# Patient Record
Sex: Female | Born: 1965 | Race: White | Hispanic: No | Marital: Single | Smoking: Never smoker
Health system: Southern US, Community
[De-identification: ages and names within clinical notes are randomized; demographics above are authoritative.]

## PROBLEM LIST (undated history)

## (undated) DIAGNOSIS — S42409A Unspecified fracture of lower end of unspecified humerus, initial encounter for closed fracture: Secondary | ICD-10-CM

## (undated) DIAGNOSIS — R232 Flushing: Secondary | ICD-10-CM

## (undated) DIAGNOSIS — R61 Generalized hyperhidrosis: Secondary | ICD-10-CM

## (undated) DIAGNOSIS — N809 Endometriosis, unspecified: Secondary | ICD-10-CM

## (undated) DIAGNOSIS — M1990 Unspecified osteoarthritis, unspecified site: Secondary | ICD-10-CM

## (undated) DIAGNOSIS — K219 Gastro-esophageal reflux disease without esophagitis: Secondary | ICD-10-CM

## (undated) DIAGNOSIS — Z9889 Other specified postprocedural states: Secondary | ICD-10-CM

## (undated) DIAGNOSIS — R112 Nausea with vomiting, unspecified: Secondary | ICD-10-CM

## (undated) DIAGNOSIS — F419 Anxiety disorder, unspecified: Secondary | ICD-10-CM

## (undated) HISTORY — DX: Unspecified osteoarthritis, unspecified site: M19.90

## (undated) HISTORY — DX: Flushing: R23.2

## (undated) HISTORY — PX: OTHER SURGICAL HISTORY: SHX169

## (undated) HISTORY — DX: Anxiety disorder, unspecified: F41.9

## (undated) HISTORY — DX: Endometriosis, unspecified: N80.9

## (undated) HISTORY — DX: Unspecified fracture of lower end of unspecified humerus, initial encounter for closed fracture: S42.409A

## (undated) HISTORY — DX: Generalized hyperhidrosis: R61

## (undated) HISTORY — PX: ABDOMINAL HYSTERECTOMY: SHX81

---

## 1988-02-14 HISTORY — PX: OTHER SURGICAL HISTORY: SHX169

## 2004-07-04 ENCOUNTER — Ambulatory Visit: Payer: Self-pay | Admitting: Family Medicine

## 2005-04-07 ENCOUNTER — Ambulatory Visit: Payer: Self-pay | Admitting: Family Medicine

## 2006-02-13 HISTORY — PX: BILATERAL SALPINGOOPHORECTOMY: SHX1223

## 2006-12-26 ENCOUNTER — Ambulatory Visit: Payer: Self-pay | Admitting: Obstetrics & Gynecology

## 2007-01-24 ENCOUNTER — Ambulatory Visit: Payer: Self-pay | Admitting: Obstetrics & Gynecology

## 2008-02-14 ENCOUNTER — Ambulatory Visit: Payer: Self-pay | Admitting: Family Medicine

## 2008-03-10 ENCOUNTER — Ambulatory Visit: Payer: Self-pay | Admitting: Sports Medicine

## 2009-02-13 DIAGNOSIS — S42409A Unspecified fracture of lower end of unspecified humerus, initial encounter for closed fracture: Secondary | ICD-10-CM

## 2009-02-13 HISTORY — DX: Unspecified fracture of lower end of unspecified humerus, initial encounter for closed fracture: S42.409A

## 2009-05-17 ENCOUNTER — Ambulatory Visit: Payer: Self-pay | Admitting: Obstetrics and Gynecology

## 2009-10-15 ENCOUNTER — Ambulatory Visit: Payer: Self-pay | Admitting: Internal Medicine

## 2009-11-22 ENCOUNTER — Ambulatory Visit: Payer: Self-pay | Admitting: Obstetrics and Gynecology

## 2009-12-08 ENCOUNTER — Ambulatory Visit: Payer: Self-pay | Admitting: Obstetrics & Gynecology

## 2009-12-08 ENCOUNTER — Encounter: Payer: Self-pay | Admitting: Family Medicine

## 2009-12-08 LAB — CONVERTED CEMR LAB
HCT: 41.5 % (ref 36.0–46.0)
MCHC: 32.8 g/dL (ref 30.0–36.0)
MCV: 98.3 fL (ref 78.0–100.0)
Platelets: 234 10*3/uL (ref 150–400)
WBC: 6.7 10*3/uL (ref 4.0–10.5)

## 2010-05-20 ENCOUNTER — Other Ambulatory Visit: Payer: Self-pay | Admitting: Family Medicine

## 2010-06-28 NOTE — Assessment & Plan Note (Signed)
[name], [name]                ACCOUNT NO.:  [id number]   MEDICAL RECORD NO.:  [id number]          PATIENT TYPE:  POB   LOCATION:  [facility] at [facility]         FACILITY:  [facility]   PHYSICIAN:  Victoria Donovan, MD        DATE OF BIRTH:  [birth year]   DATE OF SERVICE:  05/17/2009                                  CLINIC NOTE   The patient is a 45 year old Caucasian female who underwent total  abdominal hysterectomy and bilateral salpingo-oophorectomy in 2008 for  severe endometriosis, since that time she has been on estrogen  replacement therapy.  She has been on 50 mg Zoloft in order to make her  feel like herself and not killing someone, she states and she has been  on a mild diuretic.   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   PHYSICAL EXAMINATION:  GENERAL:  Well-developed, well-nourished white  female in no acute distress  VITAL SIGNS:  5 feet 10 inches tall, 162 pounds, blood pressure 122/58,  and pulse 68 per minute.  HEENT:  Within normal limits.  NECK:  Supple.  Thyroid symmetrical, no dominant masses.  LUNGS:  Clear to auscultation and percussion.  HEART:  No murmur.  Normal sinus rhythm.  BREASTS:  Symmetrical.  No dominant masses.  No nipple discharge.  ABDOMEN:  Soft, flat, nontender.  No masses or organomegaly.  GENITALIA:  External normal.  BUS within normal limits.  Vagina is clean  and well rugated.  Status, total abdominal hysterectomy.  No pelvic  masses palpated on bimanual exam.  EXTREMITIES:  No edema.  No varicosities.   IMPRESSION:  Normal gynecological examination, renewal of Zoloft; the  patient was given 100 mg daily; Enjuvia 1.25 mg a day; and bumetanide  one half a day.  Also, the  patient is going to have her first mammogram  scheduled.           ______________________________  Victoria Donovan, MD     PR/MEDQ  D:  05/17/2009  T:  05/18/2009  Job:  [id number]

## 2010-07-13 ENCOUNTER — Ambulatory Visit: Payer: Self-pay | Admitting: Family Medicine

## 2010-07-27 ENCOUNTER — Ambulatory Visit: Payer: Self-pay | Admitting: Surgery

## 2010-07-27 DIAGNOSIS — F17200 Nicotine dependence, unspecified, uncomplicated: Secondary | ICD-10-CM

## 2010-08-03 ENCOUNTER — Ambulatory Visit: Payer: Self-pay | Admitting: Surgery

## 2010-08-05 LAB — PATHOLOGY REPORT

## 2010-08-18 ENCOUNTER — Ambulatory Visit: Payer: Self-pay | Admitting: Surgery

## 2010-08-20 ENCOUNTER — Inpatient Hospital Stay: Payer: Self-pay | Admitting: Surgery

## 2010-08-24 LAB — PATHOLOGY REPORT

## 2010-09-19 ENCOUNTER — Other Ambulatory Visit: Payer: Self-pay | Admitting: *Deleted

## 2010-09-19 DIAGNOSIS — N951 Menopausal and female climacteric states: Secondary | ICD-10-CM

## 2010-09-19 MED ORDER — ESTROGENS CONJ SYNTHETIC B 1.25 MG PO TABS: 1.2500 mg | ORAL_TABLET | Freq: Every day | ORAL | Status: DC

## 2010-09-19 NOTE — Telephone Encounter (Signed)
Patient needs refill of her Enjuvia 1.25.

## 2010-09-22 ENCOUNTER — Telehealth: Payer: Self-pay | Admitting: *Deleted

## 2010-09-22 DIAGNOSIS — N951 Menopausal and female climacteric states: Secondary | ICD-10-CM

## 2010-09-22 MED ORDER — ESTRADIOL 1 MG PO TABS: 1.0000 mg | ORAL_TABLET | Freq: Every day | ORAL | Status: DC

## 2010-09-22 NOTE — Telephone Encounter (Signed)
Victoria [name] is too expensive and patient would like a generic alternitive so we will call in Estradiol 1mg .  Patient will call to let us know if this is working for her.

## 2010-10-04 ENCOUNTER — Telehealth: Payer: Self-pay | Admitting: *Deleted

## 2010-10-04 DIAGNOSIS — N951 Menopausal and female climacteric states: Secondary | ICD-10-CM

## 2010-10-04 MED ORDER — VENLAFAXINE HCL ER 75 MG PO CP24: 75.0000 mg | ORAL_CAPSULE | Freq: Every day | ORAL | Status: DC

## 2010-10-04 NOTE — Telephone Encounter (Signed)
Patient needs refill of her Effexor xr 75mg .  Rx called into [facility].

## 2010-10-05 ENCOUNTER — Telehealth: Payer: Self-pay | Admitting: *Deleted

## 2010-10-05 DIAGNOSIS — N951 Menopausal and female climacteric states: Secondary | ICD-10-CM

## 2010-10-05 MED ORDER — VENLAFAXINE HCL ER 37.5 MG PO CP24: 37.5000 mg | ORAL_CAPSULE | Freq: Every day | ORAL | Status: DC

## 2010-10-05 NOTE — Telephone Encounter (Signed)
Patient has been taking the 37.5 mg capsules three capsules daily.  Rx was changed and sent with the right dose.

## 2010-11-08 ENCOUNTER — Telehealth: Payer: Self-pay | Admitting: *Deleted

## 2010-11-08 DIAGNOSIS — N951 Menopausal and female climacteric states: Secondary | ICD-10-CM

## 2010-11-08 MED ORDER — VENLAFAXINE HCL ER 150 MG PO CP24: 150.0000 mg | ORAL_CAPSULE | Freq: Every day | ORAL | Status: DC

## 2010-11-08 NOTE — Telephone Encounter (Signed)
Patient is currently on 112.5mg  of Effexor and would like to go ahead and move up to the 150.  She is feeling a lot better but thinks the 150mg  would make things right.  I called the change in to her pharmacy and she will call back to let me know how she is doing.

## 2011-02-14 HISTORY — PX: CHOLECYSTECTOMY: SHX55

## 2011-03-22 ENCOUNTER — Ambulatory Visit: Payer: Self-pay | Admitting: Family Medicine

## 2011-05-08 ENCOUNTER — Ambulatory Visit: Payer: BC Managed Care – PPO | Admitting: Obstetrics & Gynecology

## 2011-05-08 DIAGNOSIS — N899 Noninflammatory disorder of vagina, unspecified: Secondary | ICD-10-CM

## 2011-06-12 ENCOUNTER — Telehealth: Payer: Self-pay | Admitting: *Deleted

## 2011-06-12 DIAGNOSIS — N390 Urinary tract infection, site not specified: Secondary | ICD-10-CM

## 2011-06-12 MED ORDER — CIPROFLOXACIN HCL 500 MG PO TABS: 500.0000 mg | ORAL_TABLET | Freq: Two times a day (BID) | ORAL | Status: AC

## 2011-06-12 NOTE — Telephone Encounter (Signed)
Patient is having symptoms of a uti and is not able to come in due to work schedule.  We will call in med for her and she will follow up with appointment if her symptoms do not go away.

## 2011-07-11 ENCOUNTER — Telehealth: Payer: Self-pay

## 2011-07-11 DIAGNOSIS — F419 Anxiety disorder, unspecified: Secondary | ICD-10-CM

## 2011-07-11 NOTE — Telephone Encounter (Signed)
Patient called she is on Effexor generic but says it makes her heart race would like to have Zoloft instead since she has taken this in the past and has worked for her. Please le me know what you would like me to do. Thanks!

## 2011-07-11 NOTE — Telephone Encounter (Signed)
Patient called needing to see if

## 2011-07-12 MED ORDER — SERTRALINE HCL 50 MG PO TABS: 50.0000 mg | ORAL_TABLET | Freq: Every day | ORAL | Status: DC

## 2011-07-12 NOTE — Telephone Encounter (Signed)
May call in Zoloft, try to figure out what dose---may need to involve Inetta Fermo

## 2011-07-12 NOTE — Telephone Encounter (Signed)
Addended by: Barbara Cower on: 07/12/2011 03:02 PM   Modules accepted: Orders

## 2011-08-11 ENCOUNTER — Other Ambulatory Visit: Payer: Self-pay | Admitting: *Deleted

## 2011-08-11 DIAGNOSIS — R609 Edema, unspecified: Secondary | ICD-10-CM

## 2011-08-11 MED ORDER — BUMETANIDE 1 MG PO TABS: 1.0000 mg | ORAL_TABLET | Freq: Every day | ORAL | Status: DC

## 2011-08-11 NOTE — Telephone Encounter (Signed)
Patient requesting refill of medication 

## 2011-09-08 ENCOUNTER — Telehealth: Payer: Self-pay | Admitting: *Deleted

## 2011-09-08 DIAGNOSIS — N951 Menopausal and female climacteric states: Secondary | ICD-10-CM

## 2011-09-08 MED ORDER — ESTRADIOL 1 MG PO TABS: 1.0000 mg | ORAL_TABLET | Freq: Every day | ORAL | Status: DC

## 2011-09-08 NOTE — Telephone Encounter (Signed)
Patient needs refill of estradiol 1mg  called into [facility] [place]

## 2011-10-01 ENCOUNTER — Ambulatory Visit: Payer: Self-pay

## 2012-01-01 ENCOUNTER — Ambulatory Visit: Payer: Self-pay | Admitting: Unknown Physician Specialty

## 2012-03-03 IMAGING — CR DG ABDOMEN 1V
1 series · 1 of 1 positions shown · non-contrast
Comparison: none

REASON FOR EXAM: f/u abd pain 2 weeks post lap chole
COMMENTS:

PROCEDURE:     DXR - DXR KIDNEY URETER BLADDER  - August 21, 2010  [DATE]
RESULT:     Comparisons:  None

[view not recorded]
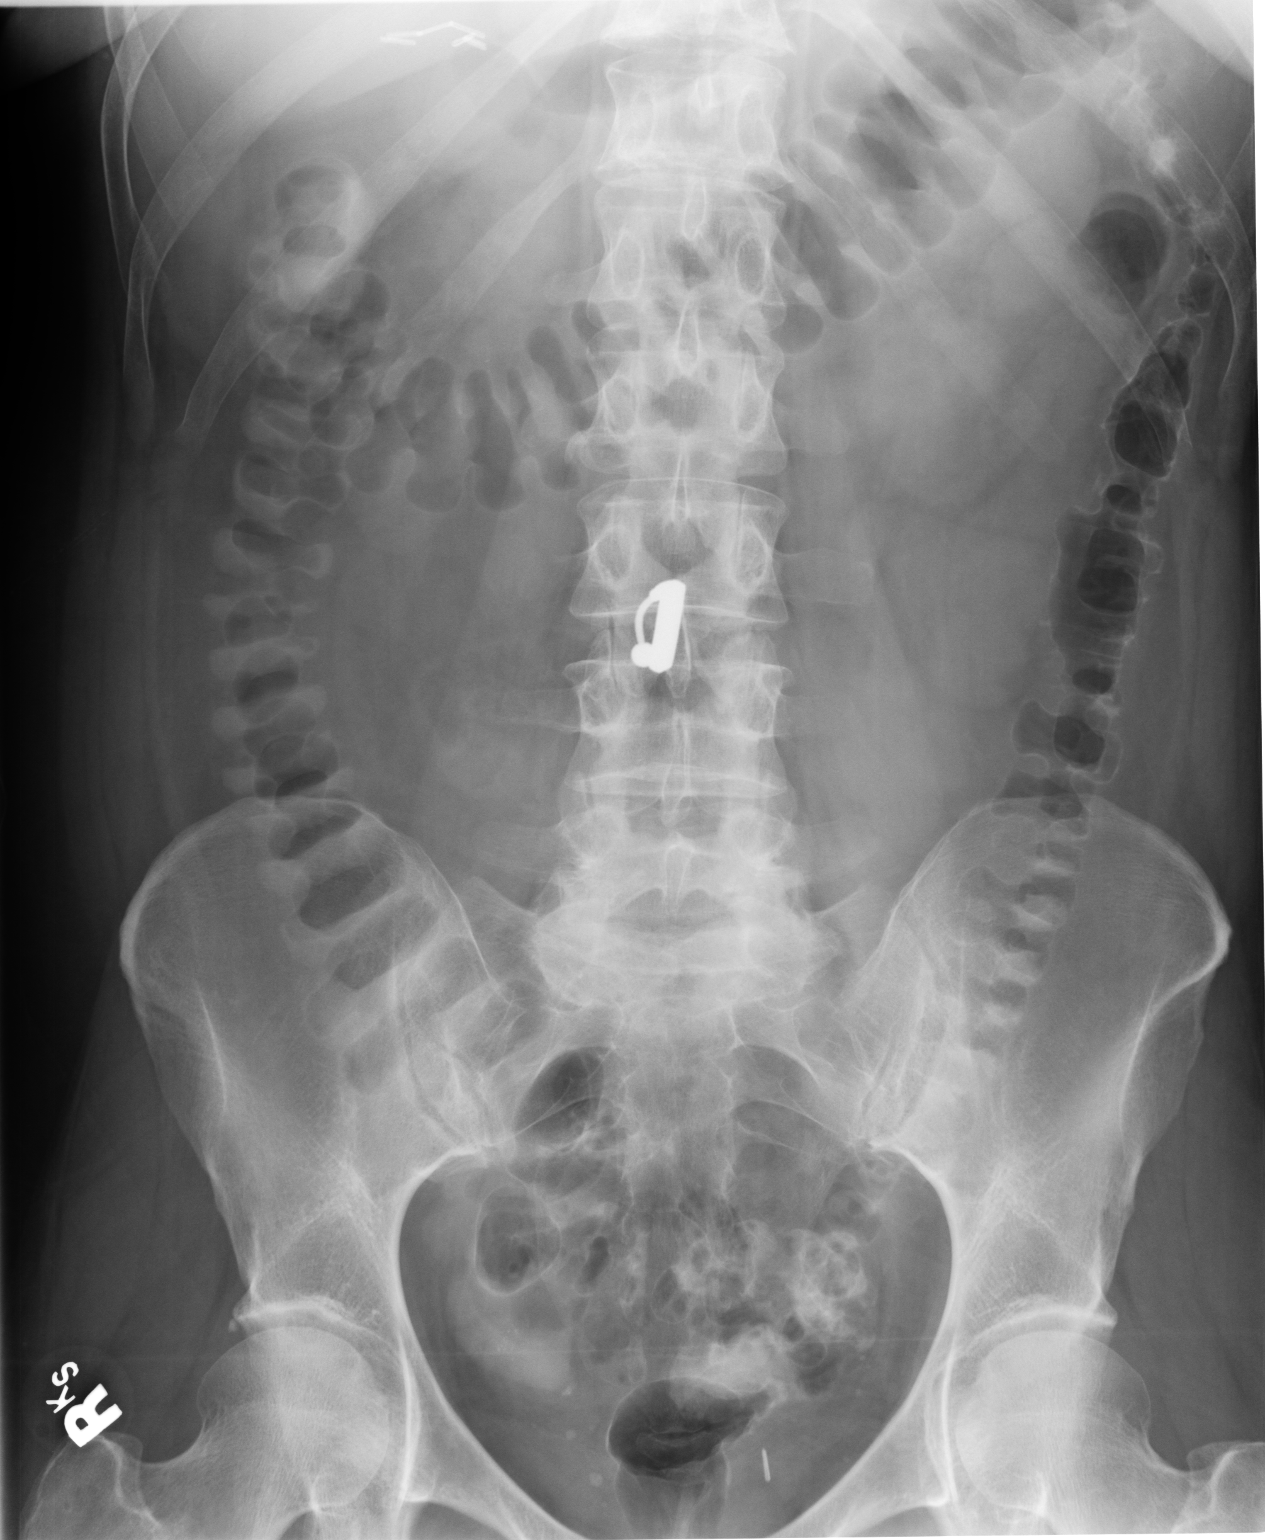

[1 of 1 positions shown; findings below may reference images not displayed]

FINDINGS: Supine radiograph of the abdomen is provided.

There is a nonspecific bowel gas pattern. There is no bowel dilatation to
suggest obstruction. There is no pathologic calcification along the expected
course of the ureters. There is no evidence of pneumoperitoneum, portal
venous gas, or pneumatosis.

The osseous structures are unremarkable.
IMPRESSION: Unremarkable KUB.

## 2012-10-15 ENCOUNTER — Ambulatory Visit: Payer: Self-pay | Admitting: Family Medicine

## 2012-12-11 ENCOUNTER — Telehealth (HOSPITAL_COMMUNITY): Payer: Self-pay | Admitting: *Deleted

## 2012-12-11 NOTE — Telephone Encounter (Signed)
Telephoned patient and left message to return call to [facility] 

## 2012-12-19 ENCOUNTER — Other Ambulatory Visit: Payer: Self-pay | Admitting: Obstetrics and Gynecology

## 2012-12-19 DIAGNOSIS — Z1231 Encounter for screening mammogram for malignant neoplasm of breast: Secondary | ICD-10-CM

## 2013-01-21 ENCOUNTER — Ambulatory Visit (HOSPITAL_COMMUNITY): Payer: BC Managed Care – PPO

## 2013-03-12 ENCOUNTER — Ambulatory Visit: Payer: Self-pay

## 2013-12-24 ENCOUNTER — Ambulatory Visit: Payer: Self-pay | Admitting: Family Medicine

## 2014-01-15 ENCOUNTER — Ambulatory Visit: Payer: Self-pay | Admitting: Unknown Physician Specialty

## 2014-01-15 LAB — CLOSTRIDIUM DIFFICILE(ARMC)

## 2014-12-15 ENCOUNTER — Other Ambulatory Visit: Payer: Self-pay | Admitting: Family Medicine

## 2014-12-15 DIAGNOSIS — R1013 Epigastric pain: Secondary | ICD-10-CM

## 2014-12-17 ENCOUNTER — Encounter: Payer: Self-pay | Admitting: Emergency Medicine

## 2014-12-17 ENCOUNTER — Ambulatory Visit (INDEPENDENT_AMBULATORY_CARE_PROVIDER_SITE_OTHER): Payer: Medicaid Other

## 2014-12-17 ENCOUNTER — Ambulatory Visit
Admission: EM | Admit: 2014-12-17 | Discharge: 2014-12-17 | Disposition: A | Discharge status: Home / Self Care | Payer: Medicaid Other | Attending: Family Medicine | Admitting: Family Medicine

## 2014-12-17 DIAGNOSIS — S8001XA Contusion of right knee, initial encounter: Secondary | ICD-10-CM | POA: Diagnosis not present

## 2014-12-17 DIAGNOSIS — M25561 Pain in right knee: Secondary | ICD-10-CM

## 2014-12-17 MED ORDER — OXYCODONE-ACETAMINOPHEN 5-325 MG PO TABS: 1.0000 | ORAL_TABLET | Freq: Three times a day (TID) | ORAL | Status: DC | PRN

## 2014-12-17 MED ORDER — MELOXICAM 15 MG PO TABS: 15.0000 mg | ORAL_TABLET | Freq: Every day | ORAL | Status: DC | PRN

## 2014-12-17 NOTE — ED Provider Notes (Signed)
____________________________________________  Time seen: Approximately 10:57 AM  I have reviewed the triage vital signs and the nursing notes.   HISTORY  Chief Complaint Knee Injury   HPI Victoria [name] is a 49 y.o. female presents with complaint of right knee pain. Patient reports last night she was walking in her garage and states that she tripped over chair cushions that were on the floor causing her to fall forwards. Patient states that she landed on her right knee hitting right knee against the side of lawnmower. States she did not hit left knee. States did not hit lawnmower blade.  Denies fall fully to the ground. Denies head injury or loss consciousness. Denies neck or back pain or injury. Reports right knee pain only.  States current right knee pain is 7 out of 10 and increases with movement and associated with weightbearing. Patient states pain is primarily to the front of her knee. States does not feel like knee will give out when applying weight but is more associated with pain. Denies prior knee pain or prior knee issues. Denies any other recent falls or injuries. Denies numbness or tingling sensation. Denies calf pain. Reports has continued to ambulate.  States right knee felt fine prior to tripping and falling. Denies chest pain, shortness breath, abdominal pain, neck pain, back pain, weakness, dizziness or other complaints.   Past Medical History  Diagnosis Date  . Severe endometriosis   . Anxiety   . Hot flashes   . Night sweats   . Arthritis   . Fracture of elbow 2011    There are no active problems to display for this patient.   Past Surgical History  Procedure Laterality Date  . Bilateral salpingoophorectomy  2008    Current Outpatient Rx  Name  Route  Sig  Dispense  Refill  . EXPIRED: bumetanide (BUMEX) 1 MG tablet   Oral   Take 1 tablet (1 mg total) by mouth daily.   45 tablet   6   . EXPIRED: estradiol (ESTRACE) 1 MG tablet   Oral   Take  1 tablet (1 mg total) by mouth daily.   30 tablet   11   . estrogens conjugated, synthetic B, (ENJUVIA) 1.25 MG tablet   Oral   Take 1.25 mg by mouth daily.         . sertraline (ZOLOFT) 50 MG tablet   Oral   Take 1 tablet (50 mg total) by mouth daily.   30 tablet   11   . EXPIRED: venlafaxine (EFFEXOR-XR) 150 MG 24 hr capsule   Oral   Take 1 capsule (150 mg total) by mouth daily.   30 capsule   11     Allergies Review of patient's allergies indicates no known allergies.  History reviewed. No pertinent family history.  Social History Social History  Substance Use Topics  . Smoking status: Never Smoker   . Smokeless tobacco: None  . Alcohol Use: Yes     Comment: 2 glasses of wine daily     Review of Systems Constitutional: No fever/chills Eyes: No visual changes. ENT: No sore throat. Cardiovascular: Denies chest pain. Respiratory: Denies shortness of breath. Gastrointestinal: No abdominal pain.  No nausea, no vomiting.  No diarrhea.  No constipation. Genitourinary: Negative for dysuria. Musculoskeletal: Negative for back pain.right knee pain.  Skin: Negative for rash. Neurological: Negative for headaches, focal weakness or numbness.  10-point ROS otherwise negative.  ____________________________________________   PHYSICAL EXAM:  VITAL SIGNS: ED Triage  Vitals  Enc Vitals Group     BP 12/17/14 1024 148/101 mmHg     Pulse Rate 12/17/14 1024 74     Resp 12/17/14 1024 20     Temp 12/17/14 1024 98 F (36.7 C)     Temp Source 12/17/14 1024 Tympanic     SpO2 12/17/14 1024 99 %     Weight --      Height 12/17/14 1024  (1.778 m)     Head Cir --      Peak Flow --      Pain Score 12/17/14 1027 7     Pain Loc --      Pain Edu? --      Excl. in GC? --    Today's Vitals   12/17/14 1024 12/17/14 1027 12/17/14 1134  BP: 148/101    Pulse: 74    Temp: 98 F (36.7 C)    TempSrc: Tympanic    Resp: 20    Height:  (1.778 m)    SpO2: 99%     PainSc:  7  6     Blood pressure recheck by Rosanne Ashing RN at 1136: 136/74  Constitutional: Alert and oriented. Well appearing and in no acute distress. Eyes: Conjunctivae are normal. PERRL. EOMI. Head: Atraumatic.  Nose: No congestion/rhinnorhea.  Mouth/Throat: Mucous membranes are moist.   Neck: No stridor.  No cervical spine tenderness to palpation. Cardiovascular: Normal rate, regular rhythm. Grossly normal heart sounds.  Good peripheral circulation. Respiratory: Normal respiratory effort.  No retractions. Lungs CTAB. Gastrointestinal: Soft and nontender. No distention.  Musculoskeletal: No lower or upper extremity tenderness nor edema.  No joint effusions. Bilateral pedal pulses equal and easily palpated. No cervical, thoracic or lumbar TTP.  Except: anterior to medial knee moderate tenderness to palpation with mild ecchymosis. Skin intact. Knees stable. Pain present with medial stress test. Full range of motion present. Mild antalgic gait. No pain with anterior or posterior drawer test, no pain with lateral stress test. Stable. Right leg otherwise nontender.  Neurologic:  Normal speech and language. No gross focal neurologic deficits are appreciated.  Skin:  Skin is warm, dry and intact. No rash noted. Psychiatric: Mood and affect are normal. Speech and behavior are normal.  ____________________________________________   LABS (all labs ordered are listed, but only abnormal results are displayed)  Labs Reviewed - No data to display  RADIOLOGY EXAM: RIGHT KNEE - COMPLETE 4+ VIEW  COMPARISON: None.  FINDINGS: Frontal, lateral, and bilateral oblique views were obtained. There is no demonstrable fracture or dislocation. There is a joint effusion. There is moderate narrowing of the patellofemoral joint. There is spurring arising from the posterior patella. There is also milder spurring medially and laterally. No erosive change. Calcifications are noted in the popliteal fossa  region.  IMPRESSION: Areas of osteoarthritic change. There is a joint effusion. No fracture or dislocation. Calcifications in the popliteal fossa region may have chronic posttraumatic or arthropathic etiology.   Electronically Signed By: Bretta Bang III M.D. On: 12/17/2014 11:02         I, Renford Dills, personally viewed and evaluated these images (plain radiographs) as part of my medical decision making.   PROCEDURES  Procedure(s) performed: Right knee immobilizer and crutches applied by RN. Neurovascular intact post application.  INITIAL IMPRESSION / ASSESSMENT AND PLAN / ED COURSE  Pertinent labs & imaging results that were available during my care of the patient were reviewed by me and considered in my medical decision making (see  chart for details).  Very well-appearing patient. No acute distress. Presents for right anterior knee pain post mechanical fall last night at home after tripping over a chair cushion in the floor causing her to fall onto right knee into lawnmower. Denies head injury or loss consciousness. Continues to ambulate but with pain. Denies other injury. Patient with anterior to medial knee moderate tenderness to palpation with mild ecchymosis. Skin intact. Knees stable. Pain present with medial stress test. Full range of motion present. Antalgic gait. Will evaluate x-ray.  Right knee xray areas of osteoarthritic changes, there is a joint effusion, no fracture or dislocation per radiologist. Will treat supportively with ice, rest, knee immobilizer, crutches, prn percocet and mobic. Suspect contusion and medial strain injury. Counseled for continued pain for patient to follow up with orthopedic this coming week, information given. Discussed follow up with Primary care physician this week. Discussed follow up and return parameters including no resolution or any worsening concerns. Patient verbalized understanding and agreed to plan.    FINAL CLINICAL  IMPRESSION(S) / ED DIAGNOSES  Final diagnoses:  Knee contusion, right, initial encounter  Right knee pain       Renford Dills, NP 12/17/14 1435  Renford Dills, NP 12/17/14 1439

## 2014-12-17 NOTE — Discharge Instructions (Signed)
Take medication as prescribed. Apply ice. Elevate. Wear splint and use crutches as long as pain continues.   As discussed, follow up with your primary care physician or orthopedic above next week as needed for continued pain.   Return to Urgent care for new or worsening concerns.   Knee Pain Knee pain is a very common symptom and can have many causes. Knee pain often goes away when you follow your health care provider's instructions for relieving pain and discomfort at home. However, knee pain can develop into a condition that needs treatment. Some conditions may include:  Arthritis caused by wear and tear (osteoarthritis).  Arthritis caused by swelling and irritation (rheumatoid arthritis or gout).  A cyst or growth in your knee.  An infection in your knee joint.  An injury that will not heal.  Damage, swelling, or irritation of the tissues that support your knee (torn ligaments or tendinitis). If your knee pain continues, additional tests may be ordered to diagnose your condition. Tests may include X-rays or other imaging studies of your knee. You may also need to have fluid removed from your knee. Treatment for ongoing knee pain depends on the cause, but treatment may include:  Medicines to relieve pain or swelling.  Steroid injections in your knee.  Physical therapy.  Surgery. HOME CARE INSTRUCTIONS  Take medicines only as directed by your health care provider.  Rest your knee and keep it raised (elevated) while you are resting.  Do not do things that cause or worsen pain.  Avoid high-impact activities or exercises, such as running, jumping rope, or doing jumping jacks.  Apply ice to the knee area:  Put ice in a plastic bag.  Place a towel between your skin and the bag.  Leave the ice on for 20 minutes, 2-3 times a day.  Ask your health care provider if you should wear an elastic knee support.  Keep a pillow under your knee when you sleep.  Lose weight if you  are overweight. Extra weight can put pressure on your knee.  Do not use any tobacco products, including cigarettes, chewing tobacco, or electronic cigarettes. If you need help quitting, ask your health care provider. Smoking may slow the healing of any bone and joint problems that you may have. SEEK MEDICAL CARE IF:  Your knee pain continues, changes, or gets worse.  You have a fever along with knee pain.  Your knee buckles or locks up.  Your knee becomes more swollen. SEEK IMMEDIATE MEDICAL CARE IF:   Your knee joint feels hot to the touch.  You have chest pain or trouble breathing.   This information is not intended to replace advice given to you by your health care provider. Make sure you discuss any questions you have with your health care provider.   Document Released: 11/27/2006 Document Revised: 02/20/2014 Document Reviewed: 09/15/2013 Elsevier Interactive Patient Education 2016 Else[employer] Inc[employer]  Contusion A contusion is a deep bruise. Contusions happen when an injury causes bleeding under the skin. Symptoms of bruising include pain, swelling, and discolored skin. The skin may turn blue, purple, or yellow. HOME CARE   Rest the injured area.  If told, put ice on the injured area.  Put ice in a plastic bag.  Place a towel between your skin and the bag.  Leave the ice on for 20 minutes, 2-3 times per day.  If told, put light pressure (compression) on the injured area using an elastic bandage. Make sure the bandage is not  too tight. Remove it and put it back on as told by your doctor.  If possible, raise (elevate) the injured area above the level of your heart while you are sitting or lying down.  Take over-the-counter and prescription medicines only as told by your doctor. GET HELP IF:  Your symptoms do not get better after several days of treatment.  Your symptoms get worse.  You have trouble moving the injured area. GET HELP RIGHT AWAY IF:   You have very bad  pain.  You have a loss of feeling (numbness) in a hand or foot.  Your hand or foot turns pale or cold.   This information is not intended to replace advice given to you by your health care provider. Make sure you discuss any questions you have with your health care provider.   Document Released: 07/19/2007 Document Revised: 10/21/2014 Document Reviewed: 06/17/2014 Elsevier Interactive Patient Education [employer]

## 2014-12-17 NOTE — ED Notes (Signed)
Pt fell hitting right knee on a lawnmower

## 2014-12-22 ENCOUNTER — Ambulatory Visit
Admission: RE | Admit: 2014-12-22 | Discharge: 2014-12-22 | Disposition: A | Discharge status: Home / Self Care | Payer: Medicaid Other | Source: Ambulatory Visit | Attending: Family Medicine | Admitting: Family Medicine

## 2014-12-22 DIAGNOSIS — R1013 Epigastric pain: Secondary | ICD-10-CM

## 2016-02-08 ENCOUNTER — Ambulatory Visit
Admission: EM | Admit: 2016-02-08 | Discharge: 2016-02-08 | Disposition: A | Discharge status: Home / Self Care | Payer: Medicaid Other | Attending: Internal Medicine | Admitting: Internal Medicine

## 2016-02-08 ENCOUNTER — Ambulatory Visit: Payer: Medicaid Other

## 2016-02-08 ENCOUNTER — Encounter: Payer: Self-pay | Admitting: Gynecology

## 2016-02-08 DIAGNOSIS — M79642 Pain in left hand: Secondary | ICD-10-CM | POA: Insufficient documentation

## 2016-02-08 DIAGNOSIS — T1490XA Injury, unspecified, initial encounter: Secondary | ICD-10-CM | POA: Diagnosis present

## 2016-02-08 DIAGNOSIS — S62337A Displaced fracture of neck of fifth metacarpal bone, left hand, initial encounter for closed fracture: Secondary | ICD-10-CM | POA: Diagnosis not present

## 2016-02-08 DIAGNOSIS — S62397A Other fracture of fifth metacarpal bone, left hand, initial encounter for closed fracture: Secondary | ICD-10-CM | POA: Insufficient documentation

## 2016-02-08 MED ORDER — HYDROCODONE-ACETAMINOPHEN 5-325 MG PO TABS: 1.0000 | ORAL_TABLET | ORAL | 0 refills | Status: DC | PRN

## 2016-02-08 MED ORDER — NAPROXEN 500 MG PO TABS: 500.0000 mg | ORAL_TABLET | Freq: Two times a day (BID) | ORAL | 0 refills | Status: DC

## 2016-02-08 NOTE — ED Triage Notes (Signed)
Per patient fell while going up the stairs at home x 3 days ago. Patient left hand swollen and bruise.

## 2016-02-08 NOTE — Discharge Instructions (Addendum)
Wear splint until followup with orthopedics in about a week.  Ice and elevate to help with swelling and pain.  Prescription for naproxen sent to [facility] in [place]; prescription for a small number of vicodin printed.

## 2016-02-08 NOTE — ED Provider Notes (Signed)
[facility]    CSN: [id number] Arrival date & time: 02/08/16  1747     History   Chief Complaint Chief Complaint  Patient presents with  . Hand Injury    HPI Victoria [name] is a 50 y.o. female. She presents today 3 days after an injury to the left hand, with marked pain and bruising and swelling of the dorsal left hand, with bruising extending to the left palm. No wrist swelling or pain. Broken skin. No other injury reported    HPI  Past Medical History:  Diagnosis Date  . Anxiety   . Arthritis   . Fracture of elbow 2011  . Hot flashes   . Night sweats   . Severe endometriosis     Past Surgical History:  Procedure Laterality Date  . BILATERAL SALPINGOOPHORECTOMY  2008       Home Medications    Prior to Admission medications   Medication Sig Start Date End Date Taking? Authorizing Provider  bumetanide (BUMEX) 1 MG tablet Take 1 tablet (1 mg total) by mouth daily. 08/11/11 02/08/16 Yes Reva Boresanya S Pratt, MD  pantoprazole (PROTONIX) 40 MG tablet Take 40 mg by mouth daily.   Yes Historical Provider, MD  estradiol (ESTRACE) 1 MG tablet Take 1 tablet (1 mg total) by mouth daily. 09/08/11 09/07/12  Tereso NewcomerUgonna A Anyanwu, MD  HYDROcodone-acetaminophen (NORCO/VICODIN) 5-325 MG tablet Take 1 tablet by mouth every 4 (four) hours as needed for severe pain. 02/08/16   Eustace MooreLaura W Murray, MD  naproxen (NAPROSYN) 500 MG tablet Take 1 tablet (500 mg total) by mouth 2 (two) times daily. 02/08/16   Eustace MooreLaura W Murray, MD    Family History No family history on file.  Social History Social History  Substance Use Topics  . Smoking status: Never Smoker  . Smokeless tobacco: Never Used  . Alcohol use Yes     Comment: 2 glasses of wine daily      Allergies   Patient has no known allergies.   Review of Systems Review of Systems  All other systems reviewed and are negative.    Physical Exam Triage Vital Signs ED Triage Vitals  Enc Vitals Group     BP 02/08/16 1817  (!) 148/95     Pulse Rate 02/08/16 1817 60     Resp 02/08/16 1817 16     Temp 02/08/16 1817 98.4 F (36.9 C)     Temp Source 02/08/16 1817 Oral     SpO2 02/08/16 1817 100 %     Weight 02/08/16 1820 170 lb (77.1 kg)     Height 02/08/16 1820 5\' 10"  (1.778 m)     Pain Score 02/08/16 1824 4   Updated Vital Signs BP (!) 148/95 (BP Location: Right Arm)   Pulse 60   Temp 98.4 F (36.9 C) (Oral)   Resp 16   Ht 5\' 10"  (1.778 m)   Wt 170 lb (77.1 kg)   SpO2 100%   BMI 24.39 kg/m  Physical Exam  Constitutional: She is oriented to person, place, and time. No distress.  Alert, nicely groomed  HENT:  Head: Atraumatic.  Eyes:  Conjugate gaze, no eye redness/drainage  Neck: Neck supple.  Cardiovascular: Normal rate.   Pulmonary/Chest: No respiratory distress.  Abdominal: She exhibits no distension.  Musculoskeletal:  No leg swelling Marked swelling, bruising, of the dorsal left hand. Focal tenderness of the third, fourth, fifth MCPs, distally. Left palm is bruised. No broken skin on the knuckles. Able to flex  and extend, pronate and supinate the wrist.  Neurological: She is alert and oriented to person, place, and time.  Skin: Skin is warm and dry.  No cyanosis  Nursing note and vitals reviewed.    UC Treatments / Results   Radiology EXAM: LEFT HAND - COMPLETE 3+ VIEW  COMPARISON: None.  FINDINGS: Acute, closed, volar and radial angulated distal fifth metacarpal shaft fracture just proximal to and involving the neck of the fifth metacarpal. Joint spaces are maintained. There is overlying soft tissue swelling.  IMPRESSION: Acute, closed, volar and radial angulated distal fifth metacarpal shaft fracture.   Electronically Signed By: Tollie Ethavid Kwon M.D. On: 02/08/2016 18:57  Procedures Procedures (including critical care time) Ulnar gutter splint applied by clinical staff   Final Clinical Impressions(s) / UC Diagnoses   Final diagnoses:  Closed displaced  fracture of neck of fifth metacarpal bone of left hand, initial encounter   Wear splint until followup with orthopedics in about a week.  Ice and elevate to help with swelling and pain.  Prescription for naproxen sent to [facility] in [place]; prescription for a small number of vicodin printed.    New Prescriptions Discharge Medication List as of 02/08/2016  7:25 PM    START taking these medications   Details  HYDROcodone-acetaminophen (NORCO/VICODIN) 5-325 MG tablet Take 1 tablet by mouth every 4 (four) hours as needed for severe pain., Starting Tue 02/08/2016, Print    naproxen (NAPROSYN) 500 MG tablet Take 1 tablet (500 mg total) by mouth 2 (two) times daily., Starting Tue 02/08/2016, Normal         Eustace MooreLaura W Murray, MD 02/11/16 [phone]

## 2017-02-23 ENCOUNTER — Other Ambulatory Visit: Payer: Self-pay | Admitting: Family Medicine

## 2017-02-23 ENCOUNTER — Ambulatory Visit
Admission: RE | Admit: 2017-02-23 | Discharge: 2017-02-23 | Disposition: A | Discharge status: Home / Self Care | Payer: BLUE CROSS/BLUE SHIELD | Source: Ambulatory Visit | Attending: Family Medicine | Admitting: Family Medicine

## 2017-02-23 DIAGNOSIS — R05 Cough: Secondary | ICD-10-CM | POA: Insufficient documentation

## 2017-02-23 DIAGNOSIS — R059 Cough, unspecified: Secondary | ICD-10-CM

## 2018-03-25 ENCOUNTER — Ambulatory Visit
Admission: RE | Admit: 2018-03-25 | Discharge: 2018-03-25 | Disposition: A | Discharge status: Home / Self Care | Payer: Managed Care, Other (non HMO) | Attending: Family Medicine | Admitting: Family Medicine

## 2018-03-25 ENCOUNTER — Other Ambulatory Visit: Payer: Self-pay | Admitting: Family Medicine

## 2018-03-25 ENCOUNTER — Ambulatory Visit
Admission: RE | Admit: 2018-03-25 | Discharge: 2018-03-25 | Disposition: A | Discharge status: Home / Self Care | Payer: Managed Care, Other (non HMO) | Source: Ambulatory Visit | Attending: Family Medicine | Admitting: Family Medicine

## 2018-03-25 DIAGNOSIS — R05 Cough: Secondary | ICD-10-CM | POA: Diagnosis not present

## 2018-03-25 DIAGNOSIS — R059 Cough, unspecified: Secondary | ICD-10-CM

## 2018-03-25 DIAGNOSIS — R0602 Shortness of breath: Secondary | ICD-10-CM | POA: Diagnosis present

## 2019-01-11 ENCOUNTER — Ambulatory Visit (INDEPENDENT_AMBULATORY_CARE_PROVIDER_SITE_OTHER): Payer: Managed Care, Other (non HMO)

## 2019-01-11 ENCOUNTER — Ambulatory Visit
Admission: EM | Admit: 2019-01-11 | Discharge: 2019-01-11 | Disposition: A | Discharge status: Home / Self Care | Payer: Managed Care, Other (non HMO) | Attending: Emergency Medicine | Admitting: Emergency Medicine

## 2019-01-11 ENCOUNTER — Other Ambulatory Visit: Payer: Self-pay

## 2019-01-11 ENCOUNTER — Encounter: Payer: Self-pay | Admitting: Emergency Medicine

## 2019-01-11 DIAGNOSIS — W108XXA Fall (on) (from) other stairs and steps, initial encounter: Secondary | ICD-10-CM | POA: Diagnosis not present

## 2019-01-11 DIAGNOSIS — S93601A Unspecified sprain of right foot, initial encounter: Secondary | ICD-10-CM

## 2019-01-11 DIAGNOSIS — M79671 Pain in right foot: Secondary | ICD-10-CM

## 2019-01-11 MED ORDER — IBUPROFEN 600 MG PO TABS: 600.0000 mg | ORAL_TABLET | Freq: Four times a day (QID) | ORAL | 0 refills | Status: DC | PRN

## 2019-01-11 NOTE — ED Triage Notes (Signed)
Patient c/o pain in her right foot.  Patient states that she fell last night and injured her right foot.

## 2019-01-11 NOTE — ED Provider Notes (Signed)
HPI  SUBJECTIVE:  Victoria [name] is a 53 y.o. female who presents with constant, lateral right foot pain starting yesterday.  States that she was wearing some wedges, slipped and fell on some stairs.  She is not sure exactly how she injured her foot, but states that she "turned it".  She was able to bear weight on it afterwards.  She is unable to characterize the pain.  She reports numbness and tingling in her toes.  She states that her ankle is not injured.  She tried some crutches, elevation, ice.  The ice helps the most.  Symptoms are worse with weightbearing and moving her toes.  She has a past medical history of bilateral foot fractures.  No history of diabetes, hypertension, smoking.  ZOX:WRUEA, Lynnell Jude, MD  Past Medical History:  Diagnosis Date  . Anxiety   . Arthritis   . Fracture of elbow 2011  . Hot flashes   . Night sweats   . Severe endometriosis     Past Surgical History:  Procedure Laterality Date  . BILATERAL SALPINGOOPHORECTOMY  2008    Family History  Adopted: Yes    Social History   Tobacco Use  . Smoking status: Never Smoker  . Smokeless tobacco: Never Used  Substance Use Topics  . Alcohol use: Yes    Comment: 2 glasses of wine daily   . Drug use: No    No current facility-administered medications for this encounter.   Current Outpatient Medications:  .  pantoprazole (PROTONIX) 40 MG tablet, Take 40 mg by mouth daily., Disp: , Rfl:  .  bumetanide (BUMEX) 1 MG tablet, Take 1 tablet (1 mg total) by mouth daily., Disp: 45 tablet, Rfl: 6 .  estradiol (ESTRACE) 1 MG tablet, Take 1 tablet (1 mg total) by mouth daily., Disp: 30 tablet, Rfl: 11 .  ibuprofen (ADVIL) 600 MG tablet, Take 1 tablet (600 mg total) by mouth every 6 (six) hours as needed., Disp: 30 tablet, Rfl: 0  Allergies  Allergen Reactions  . Venlafaxine Other (See Comments)    Chest pain with high doses  . Hydrocodone Nausea And Vomiting     ROS  As noted in HPI.   Physical  Exam  BP (!) 135/105 (BP Location: Left Arm)   Pulse 85   Temp 98.3 F (36.8 C) (Oral)   Resp 16   Ht 5\' 10"  (1.778 m)   Wt 70.3 kg   SpO2 98%   BMI 22.24 kg/m   Constitutional: Well developed, well nourished, no acute distress Eyes:  EOMI, conjunctiva normal bilaterally HENT: Normocephalic, atraumatic,mucus membranes moist Respiratory: Normal inspiratory effort Cardiovascular: Normal rate GI: nondistended skin: No rash, skin intact Musculoskeletal: Right midfoot NT.  Fourth, fifth metatarsal  tender. No bruising. Skin intact. DP 2+. Refill less than 2 seconds. Sensation grossly intact. Patient able to move all toes actively. no pain with inversion / eversion,  dorsiflexion / plantarflexion. No Tenderness along the plantar fascia. Distal fibula NT, Medial malleolus NT,  Deltoid ligament NT, Lateral ligaments NT, Achilles NT. Patient able to bear weight while in department. Neurologic: Alert & oriented x 3, no focal neuro deficits Psychiatric: Speech and behavior appropriate   ED Course   Medications - No data to display  Orders Placed This Encounter  Procedures  . DG Foot Complete Right    Standing Status:   Standing    Number of Occurrences:   1    Order Specific Question:   Reason for Exam (SYMPTOM  OR DIAGNOSIS REQUIRED)    Answer:   Right foot pain due to fall last night  . Post op shoe    Standing Status:   Standing    Number of Occurrences:   1    Order Specific Question:   Laterality    Answer:   Right  . Apply ace wrap    Standing Status:   Standing    Number of Occurrences:   1    No results found for this or any previous visit (from the past 24 hour(s)). Dg Foot Complete Right  Result Date: 01/11/2019 CLINICAL DATA:  Fall last night.  Unable to bear weight.  Pain. EXAM: RIGHT FOOT COMPLETE - 3+ VIEW COMPARISON:  None. FINDINGS: Degenerative changes at the base of first metatarsal. No fractures are seen. No other abnormalities. IMPRESSION: Degenerative  changes.  No fractures. Electronically Signed   By: Gerome Sam III M.D   On: 01/11/2019 12:44    ED Clinical Impression  1. Sprain of right foot, initial encounter      ED Assessment/Plan  Reviewed imaging independently.  degenerative changes.  No fractures.  See radiology report for full details.  Patient with a foot sprain.  No fracture on x-ray.  Placing an Ace wrap, stiff soled shoe.  She already has crutches.  Ibuprofen 600 mg combined with 1 g of Tylenol 3-4 times a day as needed for pain.  Continue ice, elevation.  Follow-up with Dr. Excell Seltzer, podiatry if not better in 10 days to 2 weeks  Discussed imaging, MDM, treatment plan, and plan for follow-up with patient. . patient agrees with plan.   Meds ordered this encounter  Medications  . ibuprofen (ADVIL) 600 MG tablet    Sig: Take 1 tablet (600 mg total) by mouth every 6 (six) hours as needed.    Dispense:  30 tablet    Refill:  0    *This clinic note was created using Scientist, clinical (histocompatibility and immunogenetics). Therefore, there may be occasional mistakes despite careful proofreading.   ?   Domenick Gong, MD 01/12/19 1523

## 2019-01-11 NOTE — Discharge Instructions (Signed)
Continue icing for 20 minutes at a time, elevate above your heart is much as possible.  Continue using your crutches as needed for comfort.  Ace wrap and stiff soled shoe for comfort.  600 mg ibuprofen combined with 1 g of Tylenol 3-4 times a day as needed for pain.

## 2020-08-09 ENCOUNTER — Other Ambulatory Visit: Payer: Self-pay | Admitting: Physician Assistant

## 2020-08-09 ENCOUNTER — Other Ambulatory Visit: Payer: Self-pay

## 2020-08-09 DIAGNOSIS — M5136 Other intervertebral disc degeneration, lumbar region: Secondary | ICD-10-CM

## 2020-08-09 DIAGNOSIS — Z9181 History of falling: Secondary | ICD-10-CM

## 2020-08-09 DIAGNOSIS — M4316 Spondylolisthesis, lumbar region: Secondary | ICD-10-CM

## 2020-08-09 DIAGNOSIS — M545 Low back pain, unspecified: Secondary | ICD-10-CM

## 2020-08-13 ENCOUNTER — Other Ambulatory Visit: Payer: Self-pay

## 2020-08-13 ENCOUNTER — Ambulatory Visit
Admission: RE | Admit: 2020-08-13 | Discharge: 2020-08-13 | Disposition: A | Discharge status: Home / Self Care | Payer: Managed Care, Other (non HMO) | Source: Ambulatory Visit | Attending: Physician Assistant | Admitting: Physician Assistant

## 2020-08-13 DIAGNOSIS — Z9181 History of falling: Secondary | ICD-10-CM | POA: Insufficient documentation

## 2020-08-13 DIAGNOSIS — M5136 Other intervertebral disc degeneration, lumbar region: Secondary | ICD-10-CM | POA: Insufficient documentation

## 2020-08-13 DIAGNOSIS — M4316 Spondylolisthesis, lumbar region: Secondary | ICD-10-CM | POA: Diagnosis present

## 2020-08-13 DIAGNOSIS — M545 Low back pain, unspecified: Secondary | ICD-10-CM | POA: Diagnosis present

## 2021-01-10 ENCOUNTER — Encounter: Payer: Self-pay | Admitting: Orthopedic Surgery

## 2021-01-10 ENCOUNTER — Other Ambulatory Visit: Payer: Self-pay | Admitting: Orthopedic Surgery

## 2021-01-10 DIAGNOSIS — Z01818 Encounter for other preprocedural examination: Secondary | ICD-10-CM

## 2021-01-10 NOTE — H&P (Signed)
NAME: Victoria [name] MRN:   [id number] DOB:   04-Oct-1965     HISTORY AND PHYSICAL  CHIEF COMPLAINT:  left hip pain  HISTORY:   Victoria [name] a 55 y.o. female  with left  Hip Pain Patient complains of left hip pain. Onset of the symptoms was several years ago. Inciting event: known DJD. The patient reports the hip pain is worse with weight bearing. Associated symptoms: none. Aggravating symptoms include: any weight bearing, going up and down stairs, and inactivity. Patient has had no prior hip problems. Previous visits for this problem: multiple, this is a longstanding diagnosis. Last seen several weeks ago by me. Evaluation to date: plain films, which were abnormal  osteoarthritis . Treatment to date: OTC analgesics, which have been somewhat effective, prescription analgesics, which have been somewhat effective, home exercise program, which has been somewhat effective, and physical therapy, which has been somewhat effective.     Plan for left total hip replacement  PAST MEDICAL HISTORY:   Past Medical History:  Diagnosis Date   Anxiety    Arthritis    Fracture of elbow 2011   Hot flashes    Night sweats    Severe endometriosis     PAST SURGICAL HISTORY:   Past Surgical History:  Procedure Laterality Date   BILATERAL SALPINGOOPHORECTOMY  2008    MEDICATIONS:  (Not in a hospital admission)   ALLERGIES:   Allergies  Allergen Reactions   Venlafaxine Other (See Comments)    Chest pain with high doses   Hydrocodone Nausea And Vomiting    REVIEW OF SYSTEMS:   Negative except HPI  FAMILY HISTORY:   Family History  Adopted: Yes    SOCIAL HISTORY:   reports that she has never smoked. She has never used smokeless tobacco. She reports current alcohol use. She reports that she does not use drugs.  PHYSICAL EXAM:  General appearance: alert, cooperative, and no distress Neck: no JVD and supple, symmetrical, trachea midline Resp: clear to auscultation bilaterally Cardio:  regular rate and rhythm, S1, S2 normal, no murmur, click, rub or gallop GI: soft, non-tender; bowel sounds normal; no masses,  no organomegaly Extremities: extremities normal, atraumatic, no cyanosis or edema and Homans sign is negative, no sign of DVT Pulses: 2+ and symmetric Skin: Skin color, texture, turgor normal. No rashes or lesions Neurologic: Alert and oriented X 3, normal strength and tone. Normal symmetric reflexes. Normal coordination and gait    LABORATORY STUDIES: No results for input(s): WBC, HGB, HCT, PLT in the last 72 hours.  No results for input(s): NA, K, CL, CO2, GLUCOSE, BUN, CREATININE, CALCIUM in the last 72 hours.  STUDIES/RESULTS:  No results found.  ASSESSMENT:  End stage osteoarthritis left hip        Active Problems:   * No active hospital problems. *    PLAN:  Left Primary Total Hip   Altamese Cabal 01/10/2021. 3:16 PM

## 2021-01-14 ENCOUNTER — Other Ambulatory Visit
Admission: RE | Admit: 2021-01-14 | Discharge: 2021-01-14 | Disposition: A | Discharge status: Home / Self Care | Payer: BC Managed Care – PPO | Source: Ambulatory Visit | Attending: Orthopedic Surgery | Admitting: Orthopedic Surgery

## 2021-01-14 ENCOUNTER — Other Ambulatory Visit: Payer: Self-pay

## 2021-01-14 DIAGNOSIS — Z01812 Encounter for preprocedural laboratory examination: Secondary | ICD-10-CM | POA: Insufficient documentation

## 2021-01-14 DIAGNOSIS — Z01818 Encounter for other preprocedural examination: Secondary | ICD-10-CM

## 2021-01-14 HISTORY — DX: Nausea with vomiting, unspecified: Z98.890

## 2021-01-14 HISTORY — DX: Gastro-esophageal reflux disease without esophagitis: K21.9

## 2021-01-14 HISTORY — DX: Other specified postprocedural states: R11.2

## 2021-01-14 LAB — TYPE AND SCREEN
ABO/RH(D): B POS
Antibody Screen: NEGATIVE

## 2021-01-14 LAB — URINALYSIS, ROUTINE W REFLEX MICROSCOPIC
Bilirubin Urine: NEGATIVE
Glucose, UA: NEGATIVE mg/dL
Hgb urine dipstick: NEGATIVE
Ketones, ur: NEGATIVE mg/dL
Leukocytes,Ua: NEGATIVE
Nitrite: NEGATIVE
Protein, ur: NEGATIVE mg/dL
Specific Gravity, Urine: 1.016 (ref 1.005–1.030)
pH: 5 (ref 5.0–8.0)

## 2021-01-14 LAB — PROTIME-INR
INR: 1 (ref 0.8–1.2)
Prothrombin Time: 13.2 seconds (ref 11.4–15.2)

## 2021-01-14 LAB — CBC
HCT: 43.5 % (ref 36.0–46.0)
Hemoglobin: 15 g/dL (ref 12.0–15.0)
MCH: 34.3 pg — ABNORMAL HIGH (ref 26.0–34.0)
MCHC: 34.5 g/dL (ref 30.0–36.0)
MCV: 99.5 fL (ref 80.0–100.0)
Platelets: 267 10*3/uL (ref 150–400)
RBC: 4.37 MIL/uL (ref 3.87–5.11)
RDW: 11.9 % (ref 11.5–15.5)
WBC: 4.9 10*3/uL (ref 4.0–10.5)
nRBC: 0 % (ref 0.0–0.2)

## 2021-01-14 LAB — BASIC METABOLIC PANEL
Anion gap: 6 (ref 5–15)
BUN: 23 mg/dL — ABNORMAL HIGH (ref 6–20)
CO2: 28 mmol/L (ref 22–32)
Calcium: 8.9 mg/dL (ref 8.9–10.3)
Chloride: 102 mmol/L (ref 98–111)
Creatinine, Ser: 0.75 mg/dL (ref 0.44–1.00)
GFR, Estimated: >60 mL/min (ref >60)
Glucose, Bld: 68 mg/dL — ABNORMAL LOW (ref 70–99)
Potassium: 3.7 mmol/L (ref 3.5–5.1)
Sodium: 136 mmol/L (ref 135–145)

## 2021-01-14 LAB — SURGICAL PCR SCREEN
MRSA, PCR: NEGATIVE
Staphylococcus aureus: NEGATIVE

## 2021-01-14 LAB — APTT: aPTT: 30 s (ref 24–36)

## 2021-01-14 NOTE — Patient Instructions (Addendum)
Your procedure is scheduled on: Wednesday January 26, 2021. Report to Day Surgery inside [facility] 2nd floor. To find out your arrival time please call [phone] between 1PM - 3PM on Tuesday January 25, 2021.  Remember: Instructions that are not followed completely may result in serious medical risk,  up to and including death, or upon the discretion of your surgeon and anesthesiologist your  surgery may need to be rescheduled.     _X__ 1. Do not eat food or drink fluids after midnight the night before your procedure.                 No chewing gum or hard candies.   __X__2.  On the morning of surgery brush your teeth with toothpaste and water, you                may rinse your mouth with mouthwash if you wish.  Do not swallow any toothpaste or mouthwash.     _X__ 3.  No Alcohol for 24 hours before or after surgery.   _X__ 4.  Do Not Smoke or use e-cigarettes For 24 Hours Prior to Your Surgery.                 Do not use any chewable tobacco products for at least 6 hours prior to                 Surgery.  _X__  5.  Do not use any recreational drugs (marijuana, cocaine, heroin, ecstasy, MDMA or other)                For at least one week prior to your surgery.  Combination of these drugs with anesthesia                May have life threatening results.  __X__6.  Notify your doctor if there is any change in your medical condition      (cold, fever, infections).     Do not wear jewelry, make-up, hairpins, clips or nail polish. Do not wear lotions, powders, or perfumes. You may wear deodorant. Do not shave 48 hours prior to surgery. Men may shave face and neck. Do not bring valuables to the hospital.    [facility] is not responsible for any belongings or valuables.  Contacts, dentures or bridgework may not be worn into surgery. Leave your suitcase in the car. After surgery it may be brought to your room. For patients admitted to the hospital,  discharge time is determined by your treatment team.   Patients discharged the day of surgery will not be allowed to drive home.   Make arrangements for someone to be with you for the first 24 hours of your Same Day Discharge.    Please read over the following fact sheets that you were given:   Total Joint Packet    __X__ Take these medicines the morning of surgery with A SIP OF WATER:    1. pantoprazole (PROTONIX) 40 MG tablet  2.   3.   4.  5.  6.  ____ Fleet Enema (as directed)   __X__ Use CHG Soap (or wipes) as directed  ____ Use Benzoyl Peroxide Gel as instructed  ____ Use inhalers on the day of surgery  ____ Stop metformin 2 days prior to surgery    ____ Take 1/2 of usual insulin dose the night before surgery. No insulin the morning          of surgery.  ____ Call your PCP, cardiologist, or Pulmonologist if taking Coumadin/Plavix/aspirin and ask when to stop before your surgery.   __X__ One Week prior to surgery- Stop Anti-inflammatories such as Ibuprofen, Aleve, Advil, Motrin, meloxicam (MOBIC), diclofenac, etodolac, ketorolac, Toradol, Daypro, piroxicam, Goody's or BC powders. OK TO USE TYLENOL IF NEEDED   __X__ Do not start any new vitamins or supplements until after surgery.    ____ Bring C-Pap to the hospital.    If you have any questions regarding your pre-procedure instructions,  Please call Pre-admit Testing at [phone]

## 2021-01-24 ENCOUNTER — Other Ambulatory Visit
Admission: RE | Admit: 2021-01-24 | Discharge: 2021-01-24 | Disposition: A | Discharge status: Home / Self Care | Payer: BC Managed Care – PPO | Source: Ambulatory Visit | Attending: Orthopedic Surgery | Admitting: Orthopedic Surgery

## 2021-01-24 ENCOUNTER — Other Ambulatory Visit: Payer: Self-pay

## 2021-01-24 DIAGNOSIS — Z20822 Contact with and (suspected) exposure to covid-19: Secondary | ICD-10-CM | POA: Insufficient documentation

## 2021-01-24 DIAGNOSIS — Z01812 Encounter for preprocedural laboratory examination: Secondary | ICD-10-CM | POA: Diagnosis present

## 2021-01-25 LAB — SARS CORONAVIRUS 2 (TAT 6-24 HRS): SARS Coronavirus 2: NEGATIVE

## 2021-01-25 NOTE — Anesthesia Preprocedure Evaluation (Addendum)
Anesthesia Evaluation  Patient identified by MRN, date of birth, ID band Patient awake    Reviewed: Allergy & Precautions, H&P , NPO status , Patient's Chart, lab work & pertinent test results  History of Anesthesia Complications (+) PONV and history of anesthetic complications  Airway Mallampati: II  TM Distance: >3 FB Neck ROM: Full    Dental no notable dental hx.    Pulmonary neg pulmonary ROS,    Pulmonary exam normal breath sounds clear to auscultation       Cardiovascular Exercise Tolerance: Good negative cardio ROS   Rhythm:Regular Rate:Normal + Peripheral Edema (R foot swelling)    Neuro/Psych PSYCHIATRIC DISORDERS Anxiety negative neurological ROS     GI/Hepatic Neg liver ROS, GERD  Medicated,  Endo/Other  negative endocrine ROS  Renal/GU negative Renal ROS  negative genitourinary   Musculoskeletal negative musculoskeletal ROS (+) Arthritis , Osteoarthritis,    Abdominal Normal abdominal exam  (+)   Peds negative pediatric ROS (+)  Hematology negative hematology ROS (+)   Anesthesia Other Findings Unilateral primary osteoarthritis, left hip  Intolerance to opioids, nausea  Reproductive/Obstetrics negative OB ROS                            Anesthesia Physical Anesthesia Plan  ASA: 2  Anesthesia Plan: General/Spinal   Post-op Pain Management:    Induction: Intravenous  PONV Risk Score and Plan: 4 or greater and Ondansetron, Dexamethasone, Midazolam and TIVA  Airway Management Planned: Natural Airway and Nasal Cannula  Additional Equipment:   Intra-op Plan:   Post-operative Plan:   Informed Consent: I have reviewed the patients History and Physical, chart, labs and discussed the procedure including the risks, benefits and alternatives for the proposed anesthesia with the patient or authorized representative who has indicated his/her understanding and acceptance.      Dental advisory given  Plan Discussed with: CRNA and Anesthesiologist  Anesthesia Plan Comments:        Anesthesia Quick Evaluation

## 2021-01-26 ENCOUNTER — Ambulatory Visit: Payer: BC Managed Care – PPO | Admitting: Anesthesiology

## 2021-01-26 ENCOUNTER — Encounter: Payer: Self-pay | Admitting: Orthopedic Surgery

## 2021-01-26 ENCOUNTER — Ambulatory Visit: Payer: BC Managed Care – PPO

## 2021-01-26 ENCOUNTER — Observation Stay
Admission: RE | Admit: 2021-01-26 | Discharge: 2021-01-28 | Disposition: A | Discharge status: Home / Self Care | Payer: BC Managed Care – PPO | Attending: Orthopedic Surgery | Admitting: Orthopedic Surgery

## 2021-01-26 ENCOUNTER — Other Ambulatory Visit: Payer: Self-pay

## 2021-01-26 ENCOUNTER — Encounter
Admission: RE | Disposition: A | Discharge status: Home / Self Care | Payer: Self-pay | Source: Home / Self Care | Attending: Orthopedic Surgery

## 2021-01-26 DIAGNOSIS — Z419 Encounter for procedure for purposes other than remedying health state, unspecified: Secondary | ICD-10-CM

## 2021-01-26 DIAGNOSIS — M1612 Unilateral primary osteoarthritis, left hip: Secondary | ICD-10-CM | POA: Diagnosis present

## 2021-01-26 DIAGNOSIS — Z96642 Presence of left artificial hip joint: Secondary | ICD-10-CM

## 2021-01-26 HISTORY — PX: TOTAL HIP ARTHROPLASTY: SHX124

## 2021-01-26 LAB — ABO/RH: ABO/RH(D): B POS

## 2021-01-26 SURGERY — ARTHROPLASTY, HIP, TOTAL, ANTERIOR APPROACH
Anesthesia: Spinal | Site: Hip | Laterality: Left

## 2021-01-26 MED ORDER — ONDANSETRON HCL 4 MG PO TABS
ORAL_TABLET | ORAL | Status: AC
  Filled 2021-01-26: qty 1

## 2021-01-26 MED ORDER — SODIUM CHLORIDE 0.9 % IV SOLN
INTRAVENOUS | Status: AC | PRN
  Administered 2021-01-26: 250 mL via INTRAMUSCULAR

## 2021-01-26 MED ORDER — FENTANYL CITRATE (PF) 100 MCG/2ML IJ SOLN: 25.0000 ug | INTRAMUSCULAR | Status: DC | PRN

## 2021-01-26 MED ORDER — OXYCODONE HCL 5 MG PO TABS
ORAL_TABLET | ORAL | Status: AC
  Administered 2021-01-26: 18:00:00 5 mg via ORAL
  Filled 2021-01-26: qty 2

## 2021-01-26 MED ORDER — ALUM & MAG HYDROXIDE-SIMETH 200-200-20 MG/5ML PO SUSP: 30.0000 mL | ORAL | Status: DC | PRN

## 2021-01-26 MED ORDER — MENTHOL 3 MG MT LOZG
1.0000 | LOZENGE | OROMUCOSAL | Status: DC | PRN
  Filled 2021-01-26: qty 9

## 2021-01-26 MED ORDER — METOCLOPRAMIDE HCL 5 MG/ML IJ SOLN
5.0000 mg | Freq: Three times a day (TID) | INTRAMUSCULAR | Status: DC | PRN
  Administered 2021-01-26: 18:00:00 5 mg via INTRAVENOUS
  Administered 2021-01-27: 10 mg via INTRAVENOUS

## 2021-01-26 MED ORDER — HYDROMORPHONE HCL 1 MG/ML IJ SOLN
0.5000 mg | INTRAMUSCULAR | Status: DC | PRN
  Administered 2021-01-26 – 2021-01-28 (×6): 0.5 mg via INTRAVENOUS

## 2021-01-26 MED ORDER — PHENYLEPHRINE 40 MCG/ML (10ML) SYRINGE FOR IV PUSH (FOR BLOOD PRESSURE SUPPORT)
PREFILLED_SYRINGE | INTRAVENOUS | Status: DC | PRN
  Administered 2021-01-26: 160 ug via INTRAVENOUS
  Administered 2021-01-26 (×3): 80 ug via INTRAVENOUS
  Administered 2021-01-26: 160 ug via INTRAVENOUS

## 2021-01-26 MED ORDER — BUPIVACAINE HCL (PF) 0.5 % IJ SOLN
INTRAMUSCULAR | Status: AC
  Filled 2021-01-26: qty 10

## 2021-01-26 MED ORDER — PROPOFOL 500 MG/50ML IV EMUL
INTRAVENOUS | Status: DC | PRN
  Administered 2021-01-26: 120 ug/kg/min via INTRAVENOUS
  Administered 2021-01-26: 130 ug/kg/min via INTRAVENOUS

## 2021-01-26 MED ORDER — CEFAZOLIN SODIUM-DEXTROSE 1-4 GM/50ML-% IV SOLN
INTRAVENOUS | Status: AC
  Filled 2021-01-26: qty 50

## 2021-01-26 MED ORDER — POVIDONE-IODINE 10 % EX SWAB
2.0000 "application " | Freq: Once | CUTANEOUS | Status: AC
  Administered 2021-01-26: 2 via TOPICAL

## 2021-01-26 MED ORDER — LACTATED RINGERS IV SOLN: INTRAVENOUS | Status: DC

## 2021-01-26 MED ORDER — KETOROLAC TROMETHAMINE 30 MG/ML IJ SOLN
INTRAMUSCULAR | Status: DC | PRN
  Administered 2021-01-26: 15 mg via INTRAVENOUS

## 2021-01-26 MED ORDER — TRANEXAMIC ACID-NACL 1000-0.7 MG/100ML-% IV SOLN
1000.0000 mg | INTRAVENOUS | Status: AC
  Administered 2021-01-26: 08:00:00 1000 mg via INTRAVENOUS

## 2021-01-26 MED ORDER — METOCLOPRAMIDE HCL 5 MG/ML IJ SOLN
INTRAMUSCULAR | Status: AC
  Filled 2021-01-26: qty 2

## 2021-01-26 MED ORDER — OXYCODONE HCL 5 MG PO TABS
5.0000 mg | ORAL_TABLET | Freq: Once | ORAL | Status: AC | PRN
  Administered 2021-01-26: 12:00:00 5 mg via ORAL

## 2021-01-26 MED ORDER — PROMETHAZINE HCL 25 MG/ML IJ SOLN: 6.2500 mg | INTRAMUSCULAR | Status: DC | PRN

## 2021-01-26 MED ORDER — MAGNESIUM HYDROXIDE 400 MG/5ML PO SUSP: 30.0000 mL | Freq: Every day | ORAL | Status: DC | PRN

## 2021-01-26 MED ORDER — BUPIVACAINE-EPINEPHRINE (PF) 0.25% -1:200000 IJ SOLN
INTRAMUSCULAR | Status: DC | PRN
  Administered 2021-01-26: 20 mL

## 2021-01-26 MED ORDER — OXYCODONE HCL 5 MG PO TABS
ORAL_TABLET | ORAL | Status: AC
  Filled 2021-01-26: qty 1

## 2021-01-26 MED ORDER — OXYCODONE HCL 5 MG PO TABS
5.0000 mg | ORAL_TABLET | ORAL | Status: DC | PRN
  Administered 2021-01-26 – 2021-01-27 (×2): 5 mg via ORAL
  Administered 2021-01-27: 10 mg via ORAL

## 2021-01-26 MED ORDER — SODIUM CHLORIDE 0.9 % IR SOLN
Status: DC | PRN
  Administered 2021-01-26: 3000 mL

## 2021-01-26 MED ORDER — CEFAZOLIN SODIUM-DEXTROSE 2-4 GM/100ML-% IV SOLN
INTRAVENOUS | Status: AC
  Administered 2021-01-26: 23:00:00 2 g via INTRAVENOUS
  Filled 2021-01-26: qty 100

## 2021-01-26 MED ORDER — BUPIVACAINE-EPINEPHRINE (PF) 0.25% -1:200000 IJ SOLN
INTRAMUSCULAR | Status: AC
  Filled 2021-01-26: qty 30

## 2021-01-26 MED ORDER — PROPOFOL 1000 MG/100ML IV EMUL
INTRAVENOUS | Status: AC
  Filled 2021-01-26: qty 100

## 2021-01-26 MED ORDER — CEFAZOLIN SODIUM-DEXTROSE 2-4 GM/100ML-% IV SOLN
2.0000 g | INTRAVENOUS | Status: AC
  Administered 2021-01-26: 08:00:00 2 g via INTRAVENOUS

## 2021-01-26 MED ORDER — TRANEXAMIC ACID-NACL 1000-0.7 MG/100ML-% IV SOLN
INTRAVENOUS | Status: AC
  Filled 2021-01-26: qty 100

## 2021-01-26 MED ORDER — 0.9 % SODIUM CHLORIDE (POUR BTL) OPTIME
TOPICAL | Status: DC | PRN
  Administered 2021-01-26: 08:00:00 500 mL

## 2021-01-26 MED ORDER — PHENOL 1.4 % MT LIQD
1.0000 | OROMUCOSAL | Status: DC | PRN
  Filled 2021-01-26: qty 177

## 2021-01-26 MED ORDER — GLYCOPYRROLATE 0.2 MG/ML IJ SOLN
INTRAMUSCULAR | Status: DC | PRN
  Administered 2021-01-26: .2 mg via INTRAVENOUS

## 2021-01-26 MED ORDER — APREPITANT 40 MG PO CAPS: 40.0000 mg | ORAL_CAPSULE | Freq: Once | ORAL | Status: AC

## 2021-01-26 MED ORDER — CEFAZOLIN SODIUM-DEXTROSE 2-4 GM/100ML-% IV SOLN
2.0000 g | Freq: Four times a day (QID) | INTRAVENOUS | Status: AC
  Administered 2021-01-26: 14:00:00 2 g via INTRAVENOUS
  Filled 2021-01-26 (×4): qty 100

## 2021-01-26 MED ORDER — STERILE WATER FOR IRRIGATION IR SOLN
Status: DC | PRN
  Administered 2021-01-26: 1000 mL

## 2021-01-26 MED ORDER — KETOROLAC TROMETHAMINE 15 MG/ML IJ SOLN
15.0000 mg | Freq: Four times a day (QID) | INTRAMUSCULAR | Status: AC
  Administered 2021-01-26: 16:00:00 15 mg via INTRAVENOUS

## 2021-01-26 MED ORDER — KETOROLAC TROMETHAMINE 15 MG/ML IJ SOLN
INTRAMUSCULAR | Status: AC
  Filled 2021-01-26: qty 1

## 2021-01-26 MED ORDER — PRONTOSAN WOUND IRRIGATION OPTIME
TOPICAL | Status: DC | PRN
  Administered 2021-01-26: 1 via TOPICAL

## 2021-01-26 MED ORDER — OXYCODONE HCL 5 MG PO TABS
10.0000 mg | ORAL_TABLET | ORAL | Status: DC | PRN
  Administered 2021-01-26: 10 mg via ORAL

## 2021-01-26 MED ORDER — GLYCOPYRROLATE 0.2 MG/ML IJ SOLN
INTRAMUSCULAR | Status: AC
  Filled 2021-01-26: qty 1

## 2021-01-26 MED ORDER — ASPIRIN 81 MG PO CHEW
CHEWABLE_TABLET | ORAL | Status: AC
  Administered 2021-01-26: 23:00:00 81 mg via ORAL
  Filled 2021-01-26: qty 1

## 2021-01-26 MED ORDER — OXYCODONE HCL 5 MG PO TABS
ORAL_TABLET | ORAL | Status: AC
  Filled 2021-01-26: qty 2

## 2021-01-26 MED ORDER — DEXMEDETOMIDINE HCL IN NACL 200 MCG/50ML IV SOLN
INTRAVENOUS | Status: AC
  Filled 2021-01-26: qty 50

## 2021-01-26 MED ORDER — METOCLOPRAMIDE HCL 10 MG PO TABS: 5.0000 mg | ORAL_TABLET | Freq: Three times a day (TID) | ORAL | Status: DC | PRN

## 2021-01-26 MED ORDER — ACETAMINOPHEN 325 MG PO TABS
325.0000 mg | ORAL_TABLET | Freq: Four times a day (QID) | ORAL | Status: DC | PRN
  Administered 2021-01-27 – 2021-01-28 (×4): 650 mg via ORAL

## 2021-01-26 MED ORDER — MIDAZOLAM HCL 5 MG/5ML IJ SOLN
INTRAMUSCULAR | Status: DC | PRN
  Administered 2021-01-26: 2 mg via INTRAVENOUS

## 2021-01-26 MED ORDER — APREPITANT 40 MG PO CAPS
ORAL_CAPSULE | ORAL | Status: AC
  Administered 2021-01-26: 06:00:00 40 mg via ORAL
  Filled 2021-01-26: qty 1

## 2021-01-26 MED ORDER — CHLORHEXIDINE GLUCONATE 0.12 % MT SOLN
OROMUCOSAL | Status: AC
  Administered 2021-01-26: 06:00:00 15 mL via OROMUCOSAL
  Filled 2021-01-26: qty 15

## 2021-01-26 MED ORDER — ONDANSETRON HCL 4 MG/2ML IJ SOLN
INTRAMUSCULAR | Status: AC
  Filled 2021-01-26: qty 2

## 2021-01-26 MED ORDER — OXYCODONE HCL 5 MG PO TABS
ORAL_TABLET | ORAL | Status: AC
  Administered 2021-01-26: 13:00:00 10 mg via ORAL
  Filled 2021-01-26: qty 2

## 2021-01-26 MED ORDER — KETOROLAC TROMETHAMINE 15 MG/ML IJ SOLN
INTRAMUSCULAR | Status: AC
  Administered 2021-01-26: 23:00:00 15 mg via INTRAVENOUS
  Filled 2021-01-26: qty 1

## 2021-01-26 MED ORDER — DEXMEDETOMIDINE HCL IN NACL 200 MCG/50ML IV SOLN
INTRAVENOUS | Status: DC | PRN
  Administered 2021-01-26 (×2): 8 ug via INTRAVENOUS
  Administered 2021-01-26: 4 ug via INTRAVENOUS

## 2021-01-26 MED ORDER — CHLORHEXIDINE GLUCONATE 0.12 % MT SOLN: 15.0000 mL | Freq: Once | OROMUCOSAL | Status: AC

## 2021-01-26 MED ORDER — ACETAMINOPHEN 10 MG/ML IV SOLN
INTRAVENOUS | Status: AC
  Filled 2021-01-26: qty 100

## 2021-01-26 MED ORDER — ACETAMINOPHEN 10 MG/ML IV SOLN
INTRAVENOUS | Status: DC | PRN
  Administered 2021-01-26: 1000 mg via INTRAVENOUS

## 2021-01-26 MED ORDER — DROPERIDOL 2.5 MG/ML IJ SOLN
0.6250 mg | Freq: Once | INTRAMUSCULAR | Status: DC | PRN
  Filled 2021-01-26: qty 2

## 2021-01-26 MED ORDER — ASPIRIN 81 MG PO CHEW
81.0000 mg | CHEWABLE_TABLET | Freq: Two times a day (BID) | ORAL | Status: DC
  Administered 2021-01-27 – 2021-01-28 (×2): 81 mg via ORAL

## 2021-01-26 MED ORDER — BUPIVACAINE HCL (PF) 0.5 % IJ SOLN
INTRAMUSCULAR | Status: DC | PRN
  Administered 2021-01-26: 2.5 mL

## 2021-01-26 MED ORDER — ONDANSETRON HCL 4 MG/2ML IJ SOLN
4.0000 mg | Freq: Four times a day (QID) | INTRAMUSCULAR | Status: DC | PRN
  Administered 2021-01-26 – 2021-01-27 (×3): 4 mg via INTRAVENOUS

## 2021-01-26 MED ORDER — BISACODYL 10 MG RE SUPP
10.0000 mg | Freq: Every day | RECTAL | Status: DC | PRN
  Administered 2021-01-28: 10 mg via RECTAL
  Filled 2021-01-26 (×3): qty 1

## 2021-01-26 MED ORDER — ONDANSETRON HCL 4 MG PO TABS
4.0000 mg | ORAL_TABLET | Freq: Four times a day (QID) | ORAL | Status: DC | PRN
  Administered 2021-01-26 – 2021-01-28 (×2): 4 mg via ORAL

## 2021-01-26 MED ORDER — HYDROMORPHONE HCL 1 MG/ML IJ SOLN
INTRAMUSCULAR | Status: AC
  Filled 2021-01-26: qty 0.5

## 2021-01-26 MED ORDER — PANTOPRAZOLE SODIUM 40 MG PO TBEC
40.0000 mg | DELAYED_RELEASE_TABLET | Freq: Every day | ORAL | Status: DC | PRN
  Filled 2021-01-26: qty 1

## 2021-01-26 MED ORDER — PHENYLEPHRINE HCL-NACL 20-0.9 MG/250ML-% IV SOLN
INTRAVENOUS | Status: DC | PRN
  Administered 2021-01-26: 30 ug/min via INTRAVENOUS

## 2021-01-26 MED ORDER — MIDAZOLAM HCL 2 MG/2ML IJ SOLN
INTRAMUSCULAR | Status: AC
  Filled 2021-01-26: qty 2

## 2021-01-26 MED ORDER — ACETAMINOPHEN 10 MG/ML IV SOLN: 1000.0000 mg | Freq: Once | INTRAVENOUS | Status: DC | PRN

## 2021-01-26 MED ORDER — OXYCODONE HCL 5 MG/5ML PO SOLN: 5.0000 mg | Freq: Once | ORAL | Status: AC | PRN

## 2021-01-26 MED ORDER — PROPOFOL 10 MG/ML IV BOLUS
INTRAVENOUS | Status: DC | PRN
  Administered 2021-01-26: 50 mg via INTRAVENOUS

## 2021-01-26 MED ORDER — PHENYLEPHRINE HCL-NACL 20-0.9 MG/250ML-% IV SOLN
INTRAVENOUS | Status: AC
  Filled 2021-01-26: qty 250

## 2021-01-26 MED ORDER — ORAL CARE MOUTH RINSE: 15.0000 mL | Freq: Once | OROMUCOSAL | Status: AC

## 2021-01-26 MED ORDER — DOCUSATE SODIUM 100 MG PO CAPS
100.0000 mg | ORAL_CAPSULE | Freq: Two times a day (BID) | ORAL | Status: DC
  Administered 2021-01-26 – 2021-01-28 (×4): 100 mg via ORAL
  Filled 2021-01-26 (×5): qty 1

## 2021-01-26 SURGICAL SUPPLY — 58 items
APL PRP STRL LF DISP 70% ISPRP (MISCELLANEOUS) ×1
BLADE SAGITTAL WIDE XTHICK NO (BLADE) ×3 IMPLANT
BNDG COHESIVE 4X5 TAN ST LF (GAUZE/BANDAGES/DRESSINGS) ×6 IMPLANT
BRUSH SCRUB EZ  4% CHG (MISCELLANEOUS) ×4
BRUSH SCRUB EZ 4% CHG (MISCELLANEOUS) ×4 IMPLANT
CHLORAPREP W/TINT 26 (MISCELLANEOUS) ×3 IMPLANT
COVER HOLE (Hips) ×1 IMPLANT
CUP R3 50MM (Hips) ×1 IMPLANT
DRAPE 3/4 80X56 (DRAPES) ×3 IMPLANT
DRAPE C-ARM 42X72 X-RAY (DRAPES) ×3 IMPLANT
DRAPE STERI IOBAN 125X83 (DRAPES) IMPLANT
DRAPE U-SHAPE 47X51 STRL (DRAPES) ×3 IMPLANT
DRSG AQUACEL AG ADV 3.5X10 (GAUZE/BANDAGES/DRESSINGS) ×1 IMPLANT
DRSG AQUACEL AG ADV 3.5X14 (GAUZE/BANDAGES/DRESSINGS) IMPLANT
ELECT REM PT RETURN 9FT ADLT (ELECTROSURGICAL) ×2
ELECTRODE REM PT RTRN 9FT ADLT (ELECTROSURGICAL) ×2 IMPLANT
GAUZE 4X4 16PLY ~~LOC~~+RFID DBL (SPONGE) ×3 IMPLANT
GAUZE XEROFORM 1X8 LF (GAUZE/BANDAGES/DRESSINGS) IMPLANT
GLOVE SURG ORTHO LTX SZ8 (GLOVE) ×6 IMPLANT
GLOVE SURG UNDER LTX SZ8 (GLOVE) ×3 IMPLANT
GOWN STRL REUS W/ TWL LRG LVL3 (GOWN DISPOSABLE) ×2 IMPLANT
GOWN STRL REUS W/ TWL XL LVL3 (GOWN DISPOSABLE) ×2 IMPLANT
GOWN STRL REUS W/TWL LRG LVL3 (GOWN DISPOSABLE) ×2
GOWN STRL REUS W/TWL XL LVL3 (GOWN DISPOSABLE) ×2
HEAD OXINIUM PLUS 0 32MM (Hips) ×1 IMPLANT
HOLSTER BOVIE DISP (MISCELLANEOUS) ×3 IMPLANT
IV NS 1000ML (IV SOLUTION)
IV NS 1000ML BAXH (IV SOLUTION) ×2 IMPLANT
IV NS 250ML (IV SOLUTION) ×2
IV NS 250ML BAXH (IV SOLUTION) IMPLANT
KIT PATIENT CARE HANA TABLE (KITS) ×3 IMPLANT
KIT TURNOVER CYSTO (KITS) ×3 IMPLANT
LINER 0 DEG 32X50MM (Hips) ×1 IMPLANT
MANIFOLD NEPTUNE II (INSTRUMENTS) ×3 IMPLANT
MAT ABSORB  FLUID 56X50 GRAY (MISCELLANEOUS) ×2
MAT ABSORB FLUID 56X50 GRAY (MISCELLANEOUS) ×2 IMPLANT
NDL SAFETY ECLIPSE 18X1.5 (NEEDLE) IMPLANT
NDL SPNL 20GX3.5 QUINCKE YW (NEEDLE) ×2 IMPLANT
NEEDLE HYPO 18GX1.5 SHARP (NEEDLE)
NEEDLE HYPO 22GX1.5 SAFETY (NEEDLE) ×3 IMPLANT
NEEDLE SPNL 20GX3.5 QUINCKE YW (NEEDLE) ×2 IMPLANT
PACK HIP PROSTHESIS (MISCELLANEOUS) ×3 IMPLANT
PADDING CAST BLEND 4X4 NS (MISCELLANEOUS) ×6 IMPLANT
PILLOW ABDUCTION MEDIUM (MISCELLANEOUS) ×3 IMPLANT
PULSAVAC PLUS IRRIG FAN TIP (DISPOSABLE) ×2
SCREW 6.5X25MM (Screw) ×1 IMPLANT
SOLUTION PRONTOSAN WOUND 350ML (IRRIGATION / IRRIGATOR) IMPLANT
SPONGE T-LAP 18X18 ~~LOC~~+RFID (SPONGE) ×12 IMPLANT
STAPLER SKIN PROX 35W (STAPLE) ×3 IMPLANT
STEM LATERAL COLLAR SZ LAT 3 (Stem) ×1 IMPLANT
SUT BONE WAX W31G (SUTURE) ×3 IMPLANT
SUT DVC 2 QUILL PDO  T11 36X36 (SUTURE) ×2
SUT DVC 2 QUILL PDO T11 36X36 (SUTURE) ×2 IMPLANT
SUT VIC AB 2-0 CT1 18 (SUTURE) ×3 IMPLANT
SYR 20ML LL LF (SYRINGE) ×3 IMPLANT
TIP FAN IRRIG PULSAVAC PLUS (DISPOSABLE) ×2 IMPLANT
WAND WEREWOLF FASTSEAL 6.0 (MISCELLANEOUS) ×1 IMPLANT
WATER STERILE IRR 500ML POUR (IV SOLUTION) ×3 IMPLANT

## 2021-01-26 NOTE — Evaluation (Signed)
Physical Therapy Evaluation Patient Details Name: Victoria [name] MRN: [id number] DOB: 1965/03/29 Today's Date: 01/26/2021  History of Present Illness  Victoria [name] is a 55yoF who comes to [facility] for elective Left THA.  Clinical Impression  Pt admitted with above diagnosis. Pt currently with functional limitations due to the deficits listed below (see "PT Problem List"). Pt in bed at entry, provides PLOF, HPI. HEP reviewed with handout, excellent A/ROM, no need for physical assist from author or self, albeit pt unable to achieve SLR, not untypical on POD0, but also a presurgical impairment per pt. ModI bed mobility and transfers, no LOB in standing. Pt becomes presyncopal after 82ft AMB, but is able to safely make it back to room. After return to chair, reclined, feet elevated, hot flashes, nausea, and fatigue drastically improve over 3 minutes. Patient's performance this date reveals decreased ability, independence, and tolerance in performing all basic mobility required for performance of activities of daily living. Pt requires additional DME, close physical assistance, and cues for safe participate in mobility. Pt will benefit from skilled PT intervention to increase independence and safety with basic mobility in preparation for discharge to the venue listed below.       Recommendations for follow up therapy are one component of a multi-disciplinary discharge planning process, led by the attending physician.  Recommendations may be updated based on patient status, additional functional criteria and insurance authorization.  Follow Up Recommendations Follow physician's recommendations for discharge plan and follow up therapies (Can be independent with HEP at DC; recommend followup OPP eval in coming weeks to address chronic hip flexion strength deficits.)    Assistance Recommended at Discharge Set up Supervision/Assistance  Functional Status Assessment Patient has had a recent decline in their  functional status and demonstrates the ability to make significant improvements in function in a reasonable and predictable amount of time.  Equipment Recommendations  BSC/3in1    Recommendations for Other Services       Precautions / Restrictions Precautions Precautions: Fall;Anterior Hip Precaution Booklet Issued: No Restrictions Weight Bearing Restrictions: Yes LLE Weight Bearing: Weight bearing as tolerated      Mobility  Bed Mobility Overal bed mobility: Modified Independent                  Transfers Overall transfer level: Modified independent Equipment used: Rolling walker (2 wheels)                    Ambulation/Gait   Gait Distance (Feet): 80 Feet Assistive device: Rolling walker (2 wheels)         General Gait Details: significant LLE abduction, cues to correct at halfway point, pt able to correct by 70%. Becomes presyncopal upon 2nd 50% of AMB (worsening nausea, hot flashing, malaise), resolves with return to sitting, feet elevated.  Stairs            Wheelchair Mobility    Modified Rankin (Stroke Patients Only)       Balance Overall balance assessment: Modified Independent                                           Pertinent Vitals/Pain Pain Assessment: 0-10 Pain Score: 4  Pain Location: operative hip Pain Descriptors / Indicators: Aching Pain Intervention(s): Limited activity within patient's tolerance;Monitored during session;Premedicated before session    Home Living Family/patient expects to be discharged to:: Private  residence Living Arrangements: Children (Son, SO, friends will be available to help with meals transport) Available Help at Discharge: Family Type of Home: House Home Access: Stairs to enter Entrance Stairs-Rails: Right Entrance Stairs-Number of Steps: 2   Home Layout: One level Home Equipment: Rolling Walker (2 wheels);Grab bars - toilet;Cane - single point      Prior Function  Prior Level of Function : Independent/Modified Independent;Working/employed;Driving             Mobility Comments: modI ADLs Comments: modI     Hand Dominance   Dominant Hand: Right    Extremity/Trunk Assessment   Upper Extremity Assessment Upper Extremity Assessment: Overall WFL for tasks assessed    Lower Extremity Assessment Lower Extremity Assessment: Overall WFL for tasks assessed       Communication      Cognition Arousal/Alertness: Awake/alert Behavior During Therapy: WFL for tasks assessed/performed Overall Cognitive Status: Within Functional Limits for tasks assessed                                          General Comments      Exercises Total Joint Exercises Ankle Circles/Pumps: AROM;10 reps;Supine Quad Sets: 10 reps;Both;Supine Gluteal Sets: Both;10 reps;Supine Towel Squeeze: Both;10 reps;Supine Short Arc Quad: Both;10 reps;Supine Heel Slides: AROM;Left;10 reps;Supine;Limitations Heel Slides Limitations: abnormally mobile and unlimited Hip ABduction/ADduction: Left;10 reps;Supine   Assessment/Plan    PT Assessment Patient needs continued PT services  PT Problem List Decreased strength;Decreased range of motion;Decreased activity tolerance;Decreased balance;Decreased mobility;Decreased knowledge of use of DME;Decreased safety awareness;Decreased knowledge of precautions       PT Treatment Interventions DME instruction;Balance training;Gait training;Stair training;Functional mobility training;Therapeutic activities;Therapeutic exercise;Patient/family education    PT Goals (Current goals can be found in the Care Plan section)  Acute Rehab PT Goals Patient Stated Goal: "I wanna make sure I don't overdo it" PT Goal Formulation: With patient Time For Goal Achievement: 02/09/21 Potential to Achieve Goals: Good    Frequency BID   Barriers to discharge        Co-evaluation               AM-PAC PT "6 Clicks" Mobility   Outcome Measure Help needed turning from your back to your side while in a flat bed without using bedrails?: None Help needed moving from lying on your back to sitting on the side of a flat bed without using bedrails?: None Help needed moving to and from a bed to a chair (including a wheelchair)?: A Little Help needed standing up from a chair using your arms (e.g., wheelchair or bedside chair)?: A Little Help needed to walk in hospital room?: A Little Help needed climbing 3-5 steps with a railing? : A Little 6 Click Score: 20    End of Session Equipment Utilized During Treatment: Gait belt Activity Tolerance: Patient tolerated treatment well;Treatment limited secondary to medical complications (Comment) (presycope sympathetic drive) Patient left: in chair;with call bell/phone within reach Nurse Communication: Mobility status PT Visit Diagnosis: Other abnormalities of gait and mobility (R26.89)    Time: [id number] PT Time Calculation (min) (ACUTE ONLY): 42 min   Charges:   PT Evaluation $PT Eval Low Complexity: 1 Low PT Treatments $Gait Training: 8-22 mins $Therapeutic Exercise: 8-22 mins       3:36 PM, 01/26/21 Rosamaria Lints, PT, DPT Physical Therapist - [facility]  [phone] [facility])  Evalette Montrose C 01/26/2021, 3:32 PM

## 2021-01-26 NOTE — Progress Notes (Signed)
Met with the patient at the bedside along with her friend She lives alone but her boyfriends and friend will be staying with her to assist They will be providing transportation to the Doctor and anywhere else needed She has a RW and raised toilet with grab bar at home She stated that she will not be needing PT services, she plans to do the PT on her Own I encouraged her to speak with the Dr about this She stated that she has no additional needs

## 2021-01-26 NOTE — H&P (Signed)
The patient has been re-examined, and the chart reviewed, and there have been no interval changes to the documented history and physical.  Plan a left total hip today.  Anesthesia is not consulted regarding a peripheral nerve block for post-operative pain.  The risks, benefits, and alternatives have been discussed at length, and the patient is willing to proceed.    

## 2021-01-26 NOTE — Anesthesia Procedure Notes (Signed)
Spinal  Patient location during procedure: OR Start time: 01/26/2021 7:35 AM End time: 01/26/2021 7:41 AM Reason for block: surgical anesthesia Staffing Performed: resident/CRNA  Anesthesiologist: Iran Ouch, MD Resident/CRNA: Jonna Clark, CRNA Preanesthetic Checklist Completed: patient identified, IV checked, site marked, risks and benefits discussed, surgical consent, monitors and equipment checked, pre-op evaluation and timeout performed Spinal Block Patient position: sitting Prep: Betadine Patient monitoring: heart rate, continuous pulse ox, blood pressure and cardiac monitor Approach: midline Location: L4-5 Injection technique: single-shot Needle Needle type: Whitacre and Introducer  Needle gauge: 24 G Needle length: 9 cm Assessment Sensory level: T8 Events: CSF return Additional Notes Negative paresthesia. Negative blood return. Positive free-flowing CSF. Expiration date of kit checked and confirmed. Patient tolerated procedure well, without complications.

## 2021-01-26 NOTE — Transfer of Care (Signed)
Immediate Anesthesia Transfer of Care Note  Patient: Victoria [name]  Procedure(s) Performed: TOTAL HIP ARTHROPLASTY ANTERIOR APPROACH (Left: Hip)  Patient Location: PACU  Anesthesia Type:Spinal  Level of Consciousness: drowsy and patient cooperative  Airway & Oxygen Therapy: Patient Spontanous Breathing  Post-op Assessment: Report given to RN and Post -op Vital signs reviewed and stable  Post vital signs: Reviewed and stable  Last Vitals:  Vitals Value Taken Time  BP 87/65 01/26/21 0946  Temp    Pulse 58 01/26/21 0947  Resp 18 01/26/21 0947  SpO2 97 % 01/26/21 0947  Vitals shown include unvalidated device data.  Last Pain:  Vitals:   01/26/21 0625  TempSrc: Temporal  PainSc: 6          Complications: No notable events documented.

## 2021-01-26 NOTE — Op Note (Signed)
01/26/2021  9:38 AM  PATIENT:  Victoria [name]   MRN: [id number]  PRE-OPERATIVE DIAGNOSIS:  Osteoarthritis left hip   POST-OPERATIVE DIAGNOSIS: Same  Procedure: Left Total Hip Replacement  Surgeon: Dola Argyle. Odis Luster, MD   Assist: Altamese Cabal, PA-C  Anesthesia: Spinal   EBL: 100 mL   Specimens: None   Drains: None   Components used: A size 3 lateral Polarstem [employer], R3 size 50 mm shell, and a 32 mm +0 mm head    Description of the procedure in detail: After informed consent was obtained and the appropriate extremity marked in the pre-operative holding area, the patient was taken to the operating room and placed in the supine position on the fracture table. All pressure points were well padded and bilateral lower extremities were place in traction spars. The hip was prepped and draped in standard sterile fashion. A spinal anesthetic had been delivered by the anesthesia team. The skin and subcutaneous tissues were injected with a mixture of Marcaine with epinephrine for post-operative pain. A longitudinal incision approximately 10 cm in length was carried out from the anterior superior iliac spine to the greater trochanter. The tensor fascia was divided and blunt dissection was taken down to the level of the joint capsule. The lateral circumflex vessels were cauterized. Deep retractors were placed and a portion of the anterior capsule was excised. Using fluoroscopy the neck cut was planned and carried out with a sagittal saw. The head was passed from the field with use of a corkscrew and hip skid. Deep retractors were placed along the acetabulum and the degenerative labrum and large osteophytes were removed with a Rongeur. The cup was sequentially reamed to a size 50 mm. The wound was irrigated and using fluoroscopy the size 50 mm cup was impacted in to anatomic position. A single screw was placed followed by a threaded hole cover. The final liner was impacted in to position.  Attention was then turned to the proximal femur. The leg was placed in extension and external rotation. The canal was opened and sequentially broached to a size 3 lateral. The trial components were placed and the hip relocated. The components were found to be in good position using fluoroscopy. The hip was dislocated and the trial components removed. The final components were impacted in to position and the hip relocated. The final components were again check with fluoroscopy and found to be in good position. Hemostasis was achieved with electrocautery. The deep capsule was injected with Marcaine and epinephrine. The wound was irrigated with bacitracin laced normal saline and the tensor fascia closed with #2 Quill suture. The subcutaneous tissues were closed with 2-0 vicryl and staples for the skin. A sterile dressing was applied and an abduction pillow. Patient tolerated the procedure well and there were no apparent complication. Patient was taken to the recovery room in good condition.   Cassell Smiles, MD

## 2021-01-26 NOTE — Anesthesia Postprocedure Evaluation (Signed)
Anesthesia Post Note  Patient: Victoria [name]  Procedure(s) Performed: TOTAL HIP ARTHROPLASTY ANTERIOR APPROACH (Left: Hip)  Patient location during evaluation: PACU Anesthesia Type: Combined General/Spinal Level of consciousness: awake and alert Pain management: pain level controlled Vital Signs Assessment: post-procedure vital signs reviewed and stable Respiratory status: spontaneous breathing, nonlabored ventilation and respiratory function stable Cardiovascular status: blood pressure returned to baseline and stable Postop Assessment: no apparent nausea or vomiting Anesthetic complications: no   No notable events documented.   Last Vitals:  Vitals:   01/26/21 1215 01/26/21 1244  BP: 117/89 (!) 150/89  Pulse:  65  Resp: 16 16  Temp:  (!) 36.3 C  SpO2: 100% 100%    Last Pain:  Vitals:   01/26/21 1413  TempSrc:   PainSc: 4                  Foye Deer

## 2021-01-27 ENCOUNTER — Encounter: Payer: Self-pay | Admitting: Orthopedic Surgery

## 2021-01-27 DIAGNOSIS — M1612 Unilateral primary osteoarthritis, left hip: Secondary | ICD-10-CM | POA: Diagnosis not present

## 2021-01-27 LAB — BASIC METABOLIC PANEL
Anion gap: 7 (ref 5–15)
BUN: 12 mg/dL (ref 6–20)
CO2: 26 mmol/L (ref 22–32)
Calcium: 8.4 mg/dL — ABNORMAL LOW (ref 8.9–10.3)
Chloride: 101 mmol/L (ref 98–111)
Creatinine, Ser: 0.64 mg/dL (ref 0.44–1.00)
GFR, Estimated: >60 mL/min (ref >60)
Glucose, Bld: 129 mg/dL — ABNORMAL HIGH (ref 70–99)
Potassium: 3.9 mmol/L (ref 3.5–5.1)
Sodium: 134 mmol/L — ABNORMAL LOW (ref 135–145)

## 2021-01-27 LAB — CBC
HCT: 36.9 % (ref 36.0–46.0)
Hemoglobin: 13.1 g/dL (ref 12.0–15.0)
MCH: 33.6 pg (ref 26.0–34.0)
MCHC: 35.5 g/dL (ref 30.0–36.0)
MCV: 94.6 fL (ref 80.0–100.0)
Platelets: 184 10*3/uL (ref 150–400)
RBC: 3.9 MIL/uL (ref 3.87–5.11)
RDW: 11.7 % (ref 11.5–15.5)
WBC: 10.2 10*3/uL (ref 4.0–10.5)
nRBC: 0 % (ref 0.0–0.2)

## 2021-01-27 MED ORDER — SODIUM CHLORIDE FLUSH 0.9 % IV SOLN
INTRAVENOUS | Status: AC
  Filled 2021-01-27: qty 10

## 2021-01-27 MED ORDER — ASPIRIN 81 MG PO CHEW
CHEWABLE_TABLET | ORAL | Status: AC
  Filled 2021-01-27: qty 1

## 2021-01-27 MED ORDER — DOCUSATE SODIUM 100 MG PO CAPS: 100.0000 mg | ORAL_CAPSULE | Freq: Two times a day (BID) | ORAL | 0 refills | Status: AC

## 2021-01-27 MED ORDER — ASPIRIN 81 MG PO CHEW: 81.0000 mg | CHEWABLE_TABLET | Freq: Two times a day (BID) | ORAL | 0 refills | Status: AC

## 2021-01-27 MED ORDER — HYDROMORPHONE HCL 1 MG/ML IJ SOLN
INTRAMUSCULAR | Status: AC
  Administered 2021-01-27: 0.5 mg via INTRAVENOUS
  Filled 2021-01-27: qty 0.5

## 2021-01-27 MED ORDER — ONDANSETRON HCL 4 MG/2ML IJ SOLN
INTRAMUSCULAR | Status: AC
  Filled 2021-01-27: qty 2

## 2021-01-27 MED ORDER — ACETAMINOPHEN 325 MG PO TABS
ORAL_TABLET | ORAL | Status: AC
  Filled 2021-01-27: qty 2

## 2021-01-27 MED ORDER — ONDANSETRON HCL 4 MG PO TABS: 4.0000 mg | ORAL_TABLET | Freq: Four times a day (QID) | ORAL | 1 refills | Status: AC | PRN

## 2021-01-27 MED ORDER — METOCLOPRAMIDE HCL 5 MG/ML IJ SOLN
INTRAMUSCULAR | Status: AC
  Filled 2021-01-27: qty 2

## 2021-01-27 MED ORDER — HYDROMORPHONE HCL 2 MG PO TABS: 2.0000 mg | ORAL_TABLET | ORAL | 0 refills | Status: AC | PRN

## 2021-01-27 MED ORDER — HYDROMORPHONE HCL 1 MG/ML IJ SOLN
INTRAMUSCULAR | Status: AC
  Filled 2021-01-27: qty 0.5

## 2021-01-27 MED ORDER — KETOROLAC TROMETHAMINE 15 MG/ML IJ SOLN
INTRAMUSCULAR | Status: AC
  Administered 2021-01-27: 15 mg via INTRAVENOUS
  Filled 2021-01-27: qty 1

## 2021-01-27 MED ORDER — OXYCODONE HCL 5 MG PO TABS
ORAL_TABLET | ORAL | Status: AC
  Filled 2021-01-27: qty 2

## 2021-01-27 MED ORDER — OXYCODONE HCL 5 MG PO TABS
ORAL_TABLET | ORAL | Status: AC
  Filled 2021-01-27: qty 1

## 2021-01-27 NOTE — Progress Notes (Signed)
Physical Therapy Treatment Patient Details Name: Victoria [name] MRN: [id number] DOB: 1965/03/16 Today's Date: 01/27/2021   History of Present Illness Victoria [name] is a 55yoF who comes to [facility] for elective Left THA.    PT Comments    Pt still in PACU, now eating food at times. Pt agreeable to session, pain adequately managed. Pt tolerates run through her HEP with handout again. ROM remains advanced for post surgical timeline. Pt asks about clearance for some LLE end range FADIR stretching, against which author advises. Pt has fairly well controlled nausea throughout seated portion of session, but when agreeable to stand to reassess tolerance and vitals, she quickly becomes queasy and generally uneasy, eventually unable to stay up through completion of 2nd standing BP (which finishes in seated position. Pt encouraged to consider another overnight stay for BP issues to improve. Pt encouraged to continue to take PO fluids aggressively. Pt AMB to BR with NSG, but no further AMB pursed with PT at this time.     Recommendations for follow up therapy are one component of a multi-disciplinary discharge planning process, led by the attending physician.  Recommendations may be updated based on patient status, additional functional criteria and insurance authorization.  Follow Up Recommendations  Follow physician's recommendations for discharge plan and follow up therapies     Assistance Recommended at Discharge Set up Supervision/Assistance  Equipment Recommendations  BSC/3in1    Recommendations for Other Services       Precautions / Restrictions Precautions Precautions: Fall;Anterior Hip Precaution Booklet Issued: Yes (comment) Restrictions LLE Weight Bearing: Weight bearing as tolerated     Mobility  Bed Mobility               General bed mobility comments: in chair upon arrival    Transfers Overall transfer level: Modified independent Equipment used: Rolling walker (2  wheels)               General transfer comment: twice in session, able to shoe static balance with RW for 1st BP, but unable to tolerate standing through 2nd BP assessment due to worsening presyncope    Ambulation/Gait Ambulation/Gait assistance:  (defered due to poor upright tolerance, BP drop, nausea provocation; Pt has been AMB across hall to [facility] with NSG)                 Stairs             Wheelchair Mobility    Modified Rankin (Stroke Patients Only)       Balance                                            Cognition Arousal/Alertness: Awake/alert Behavior During Therapy: WFL for tasks assessed/performed Overall Cognitive Status: Within Functional Limits for tasks assessed                                          Exercises Total Joint Exercises Gluteal Sets: Both;10 reps;Supine Towel Squeeze: Both;10 reps;Supine Short Arc Quad: Supine;Left;15 reps Heel Slides: AROM;Left;10 reps;Supine;Limitations Hip ABduction/ADduction: Left;10 reps;Supine;AAROM    General Comments        Pertinent Vitals/Pain Pain Assessment: 0-10 Pain Score: 5  Pain Location: operative hip Pain Descriptors / Indicators: Aching Pain Intervention(s): Limited activity within patient's tolerance  Home Living                          Prior Function            PT Goals (current goals can now be found in the care plan section) Acute Rehab PT Goals Patient Stated Goal: "I wanna make sure I don't overdo it" PT Goal Formulation: With patient Time For Goal Achievement: 02/09/21 Potential to Achieve Goals: Good Progress towards PT goals: Progressing toward goals    Frequency    BID      PT Plan Current plan remains appropriate    Co-evaluation              AM-PAC PT "6 Clicks" Mobility   Outcome Measure  Help needed turning from your back to your side while in a flat bed without using bedrails?: None Help  needed moving from lying on your back to sitting on the side of a flat bed without using bedrails?: None Help needed moving to and from a bed to a chair (including a wheelchair)?: A Little Help needed standing up from a chair using your arms (e.g., wheelchair or bedside chair)?: A Little Help needed to walk in hospital room?: A Little Help needed climbing 3-5 steps with a railing? : A Little 6 Click Score: 20    End of Session Equipment Utilized During Treatment: Gait belt Activity Tolerance: Treatment limited secondary to medical complications (Comment);Patient tolerated treatment well Patient left: in chair;with call bell/phone within reach;with family/visitor present Nurse Communication: Mobility status PT Visit Diagnosis: Other abnormalities of gait and mobility (R26.89)     Time: 7564-3329 PT Time Calculation (min) (ACUTE ONLY): 33 min  Charges:  $Therapeutic Exercise: 8-22 mins $Therapeutic Activity: 8-22 mins                    3:03 PM, 01/27/21 Rosamaria Lints, PT, DPT Physical Therapist - [facility]  [phone] (ASCOM)   [name] 01/27/2021, 2:59 PM

## 2021-01-27 NOTE — Progress Notes (Signed)
Patient ambulated to the restroom with a stand by assist x1. Did great. No dizziness. Patient also walked about 30 foot to the OR doors. Patient was able to get her self in and out of bed. Patients scd's reapplied. Ice Pack refreshed. Personal care items given to patient.

## 2021-01-27 NOTE — Progress Notes (Signed)
Subjective:  Patient reports pain as moderate.    Objective:   VITALS:   Vitals:   01/26/21 2000 01/27/21 0000 01/27/21 0440 01/27/21 0738  BP: 133/64 125/87 121/78 112/83  Pulse: 74 97 70 (!) 56  Resp: $Remo'16 16 14 14  'gmAmQ$ Temp: 98.3 F (36.8 C) 98.5 F (36.9 C) 98.7 F (37.1 C) 98.4 F (36.9 C)  TempSrc: Temporal Temporal  Oral  SpO2: 94% 99% 97% 95%  Weight:      Height:        PHYSICAL EXAM:  Neurologically intact ABD soft Neurovascular intact Sensation intact distally Intact pulses distally Dorsiflexion/Plantar flexion intact Incision: dressing C/D/I No cellulitis present Compartment soft  LABS  Results for orders placed or performed during the hospital encounter of 01/26/21 (from the past 24 hour(s))  CBC     Status: None   Collection Time: 01/27/21  3:29 AM  Result Value Ref Range   WBC 10.2 4.0 - 10.5 K/uL   RBC 3.90 3.87 - 5.11 MIL/uL   Hemoglobin 13.1 12.0 - 15.0 g/dL   HCT 36.9 36.0 - 46.0 %   MCV 94.6 80.0 - 100.0 fL   MCH 33.6 26.0 - 34.0 pg   MCHC 35.5 30.0 - 36.0 g/dL   RDW 11.7 11.5 - 15.5 %   Platelets 184 150 - 400 K/uL   nRBC 0.0 0.0 - 0.2 %  Basic metabolic panel     Status: Abnormal   Collection Time: 01/27/21  3:29 AM  Result Value Ref Range   Sodium 134 (L) 135 - 145 mmol/L   Potassium 3.9 3.5 - 5.1 mmol/L   Chloride 101 98 - 111 mmol/L   CO2 26 22 - 32 mmol/L   Glucose, Bld 129 (H) 70 - 99 mg/dL   BUN 12 6 - 20 mg/dL   Creatinine, Ser 0.64 0.44 - 1.00 mg/dL   Calcium 8.4 (L) 8.9 - 10.3 mg/dL   GFR, Estimated >60 >60 mL/min   Anion gap 7 5 - 15    DG C-Arm 1-60 Min-No Report  Result Date: 01/26/2021 Fluoroscopy was utilized by the requesting physician.  No radiographic interpretation.   DG HIP UNILAT WITH PELVIS 1V LEFT  Result Date: 01/26/2021 CLINICAL DATA:  Fluoroscopic assistance for left hip arthroplasty EXAM: DG HIP (WITH OR WITHOUT PELVIS) 1V*L* COMPARISON:  None. FINDINGS: Fluoroscopic assistance was provided for left  hip arthroplasty. Fluoroscopic time was 8 seconds. Radiation dose is 0.978 mGy. IMPRESSION: Fluoroscopic assistance was provided for left hip arthroplasty. Electronically Signed   By: Elmer Picker M.D.   On: 01/26/2021 12:32    Assessment/Plan: 1 Day Post-Op   Principal Problem:   History of total hip replacement, left   Advance diet Up with therapy Discharge home today if PT goals met   Carlynn Spry , PA-C 01/27/2021, 8:14 AM

## 2021-01-27 NOTE — Progress Notes (Signed)
PT Cancellation Note  Patient Details Name: Victoria [name] MRN: [id number] DOB: [birth year]   Cancelled Treatment:      Orthostatic VS for the past 24 hrs:  BP- Lying Pulse- Lying BP- Sitting Pulse- Sitting BP- Standing at 0 minutes Pulse- Standing at 0 minutes  01/27/21 1050 118/74 52 111/71 54 95/76 63    Pt having more nausea with upright again today, similar to yesterday. RN aware and modify meds in response. Will check back later when pt is better tolerating upright.    10:59 AM, 01/27/21 Rosamaria Lints, PT, DPT Physical Therapist - [facility]  [phone] (ASCOM)    Ramces Shomaker C 01/27/2021, 10:55 AM

## 2021-01-27 NOTE — Discharge Summary (Signed)
Physician Discharge Summary  Patient ID: Victoria [name] MRN: [id number] DOB/AGE: 1965-11-04 55 y.o.  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    She was given perioperative antibiotics:  Anti-infectives (From admission, onward)    Start     Dose/Rate Route Frequency Ordered Stop   01/26/21 1000  ceFAZolin (ANCEF) IVPB 2g/100 mL premix        2 g 200 mL/hr over 30 Minutes Intravenous Every 6 hours 01/26/21 0958 01/26/21 2316   01/26/21 0615  ceFAZolin (ANCEF) IVPB 2g/100 mL premix        2 g 200 mL/hr over 30 Minutes Intravenous On call to O.R. 01/26/21 0605 01/26/21 0809   01/26/21 0609  ceFAZolin (ANCEF) 1-4 GM/50ML-% IVPB       Note to Pharmacy: Desma Paganini S: cabinet override      01/26/21 0609 01/26/21 0800     .  She was given sequential compression devices, early ambulation, and aspirin for DVT prophylaxis.  She benefited maximally from the hospital stay and there were no complications.    Recent vital signs:  Vitals:   01/27/21 0440 01/27/21 0738   BP: 121/78 112/83  Pulse: 70 (!) 56  Resp: 14 14  Temp: 98.7 F (37.1 C) 98.4 F (36.9 C)  SpO2: 97% 95%    Recent laboratory studies:  Lab Results  Component Value Date   HGB 13.1 01/27/2021   HGB 15.0 01/14/2021   HGB 13.6 12/08/2009   Lab Results  Component Value Date   WBC 10.2 01/27/2021   PLT 184 01/27/2021   Lab Results  Component Value Date   INR 1.0 01/14/2021   Lab Results  Component Value Date   NA 134 (L) 01/27/2021   K 3.9 01/27/2021   CL 101 01/27/2021   CO2 26 01/27/2021   BUN 12 01/27/2021   CREATININE 0.64 01/27/2021   GLUCOSE 129 (H) 01/27/2021    Discharge Medications:   Allergies as of 01/27/2021       Reactions   Venlafaxine Other (See Comments)   Chest pain with high doses   Hydrocodone Nausea And Vomiting        Medication List     STOP taking these medications    traMADol 50 MG tablet Commonly known as: ULTRAM       TAKE these medications    acetaminophen 500 MG tablet Commonly known as: TYLENOL Take 500 mg by mouth every 8 (eight) hours as needed for moderate pain.   aspirin 81 MG chewable tablet Chew 1 tablet (81 mg total) by mouth 2 (two) times daily.  diclofenac Sodium 1 % Gel Commonly known as: VOLTAREN Apply topically 4 (four) times daily.   docusate sodium 100 MG capsule Commonly known as: COLACE Take 1 capsule (100 mg total) by mouth 2 (two) times daily.   HYDROmorphone 2 MG tablet Commonly known as: Dilaudid Take 1 tablet (2 mg total) by mouth every 4 (four) hours as needed for moderate pain.   ketorolac 10 MG tablet Commonly known as: TORADOL Take 10 mg by mouth every 6 (six) hours as needed.   ondansetron 4 MG tablet Commonly known as: ZOFRAN Take 1 tablet (4 mg total) by mouth every 6 (six) hours as needed for nausea.   pantoprazole 40 MG tablet Commonly known as: PROTONIX Take 40 mg by mouth daily as needed (acid reflux).               Durable Medical Equipment  (From admission,  onward)           Start     Ordered   01/27/21 0820  For home use only DME Walker rolling  Once       Question Answer Comment  Walker: With 5 Inch Wheels   Patient needs a walker to treat with the following condition Osteoarthritis of left hip      01/27/21 0819   01/27/21 0819  For home use only DME 3 n 1  Once        01/27/21 0819   01/26/21 1241  DME Walker rolling  Once       Question:  Patient needs a walker to treat with the following condition  Answer:  History of total hip replacement, left   01/26/21 1240   01/26/21 1241  DME 3 n 1  Once        01/26/21 1240   01/26/21 1241  DME Bedside commode  Once       Question:  Patient needs a bedside commode to treat with the following condition  Answer:  History of total hip replacement, left   01/26/21 1240            Diagnostic Studies: DG C-Arm 1-60 Min-No Report  Result Date: 01/26/2021 Fluoroscopy was utilized by the requesting physician.  No radiographic interpretation.   DG HIP UNILAT WITH PELVIS 1V LEFT  Result Date: 01/26/2021 CLINICAL DATA:  Fluoroscopic assistance for left hip arthroplasty EXAM: DG HIP (WITH OR WITHOUT PELVIS) 1V*L* COMPARISON:  None. FINDINGS: Fluoroscopic assistance was provided for left hip arthroplasty. Fluoroscopic time was 8 seconds. Radiation dose is 0.978 mGy. IMPRESSION: Fluoroscopic assistance was provided for left hip arthroplasty. Electronically Signed   By: Ernie Avena M.D.   On: 01/26/2021 12:32    Disposition: Discharge disposition: 01-Home or Self Care            Signed: Altamese Cabal ,PA-C 01/27/2021, 8:19 AM

## 2021-01-27 NOTE — Progress Notes (Signed)
Patients foley removed at [phone]

## 2021-01-27 NOTE — Discharge Instructions (Signed)

## 2021-01-28 DIAGNOSIS — M1612 Unilateral primary osteoarthritis, left hip: Secondary | ICD-10-CM | POA: Diagnosis not present

## 2021-01-28 LAB — CBC
HCT: 34.7 % — ABNORMAL LOW (ref 36.0–46.0)
Hemoglobin: 12.2 g/dL (ref 12.0–15.0)
MCH: 33.9 pg (ref 26.0–34.0)
MCHC: 35.2 g/dL (ref 30.0–36.0)
MCV: 96.4 fL (ref 80.0–100.0)
Platelets: 162 10*3/uL (ref 150–400)
RBC: 3.6 MIL/uL — ABNORMAL LOW (ref 3.87–5.11)
RDW: 11.7 % (ref 11.5–15.5)
WBC: 7.1 10*3/uL (ref 4.0–10.5)
nRBC: 0 % (ref 0.0–0.2)

## 2021-01-28 LAB — SURGICAL PATHOLOGY

## 2021-01-28 MED ORDER — HYDROMORPHONE HCL 2 MG PO TABS
2.0000 mg | ORAL_TABLET | ORAL | 0 refills | Status: AC | PRN
Start: 2021-01-28 — End: ?

## 2021-01-28 MED ORDER — ASPIRIN 81 MG PO CHEW
CHEWABLE_TABLET | ORAL | Status: AC
  Filled 2021-01-28: qty 1

## 2021-01-28 MED ORDER — HYDROMORPHONE HCL 1 MG/ML IJ SOLN
INTRAMUSCULAR | Status: AC
  Filled 2021-01-28: qty 0.5

## 2021-01-28 MED ORDER — HYDROMORPHONE HCL 2 MG PO TABS
ORAL_TABLET | ORAL | Status: AC
  Administered 2021-01-28: 2 mg via ORAL
  Filled 2021-01-28: qty 1

## 2021-01-28 MED ORDER — ACETAMINOPHEN 325 MG PO TABS
ORAL_TABLET | ORAL | Status: AC
  Filled 2021-01-28: qty 2

## 2021-01-28 MED ORDER — DOCUSATE SODIUM 100 MG PO CAPS
ORAL_CAPSULE | ORAL | Status: AC
  Administered 2021-01-28: 100 mg via ORAL
  Filled 2021-01-28: qty 1

## 2021-01-28 MED ORDER — ASPIRIN 81 MG PO CHEW
CHEWABLE_TABLET | ORAL | Status: AC
  Administered 2021-01-28: 81 mg via ORAL
  Filled 2021-01-28: qty 1

## 2021-01-28 MED ORDER — HYDROMORPHONE HCL 2 MG PO TABS: 2.0000 mg | ORAL_TABLET | ORAL | Status: DC | PRN

## 2021-01-28 MED ORDER — HYDROMORPHONE HCL 2 MG PO TABS
2.0000 mg | ORAL_TABLET | ORAL | Status: DC | PRN
  Administered 2021-01-28: 2 mg via ORAL

## 2021-01-28 MED ORDER — ONDANSETRON HCL 4 MG PO TABS
ORAL_TABLET | ORAL | Status: AC
  Filled 2021-01-28: qty 1

## 2021-01-28 MED ORDER — DOCUSATE SODIUM 100 MG PO CAPS
ORAL_CAPSULE | ORAL | Status: AC
  Filled 2021-01-28: qty 1

## 2021-01-28 NOTE — Progress Notes (Signed)
Physical Therapy Treatment Patient Details Name: Victoria [name] MRN: [id number] DOB: 21-Jan-1966 Today's Date: 01/28/2021   History of Present Illness Victoria [name] is a 55yoF who comes to [facility] for elective Left THA.    PT Comments    Pt in bed, appears much mor energetic today. Reports to feel much better, slept fairly well. Has been AMB to BR across hall last night and today with success. No assist or cues needed for bed mobility and transfers. Pt able to sustain upright activity >3 minutes prior to worsening queasiness, hot flash, and pain, encouraged prudent activity limitation for symptoms management and safety. Pt able to perform 4 entry stairs up and down. Pt confident in safe mobility for DC today. Author believes patient has shown ability to self monitor symptoms with presyncope and remain safe. Activity tolerance much improved although still not totally resolved. Pt asking again about clearance for her normal hip stretching routine on her operative leg, most of which include end range ROM in multiple planes. Pt encouraged to discuss this with Dr. Odis Luster and/or PA, but otherwise to avoid at this time due to general risk associates with dislocation of new hip prosthesis.    Recommendations for follow up therapy are one component of a multi-disciplinary discharge planning process, led by the attending physician.  Recommendations may be updated based on patient status, additional functional criteria and insurance authorization.  Follow Up Recommendations  Follow physician's recommendations for discharge plan and follow up therapies     Assistance Recommended at Discharge Set up Supervision/Assistance  Equipment Recommendations  BSC/3in1    Recommendations for Other Services       Precautions / Restrictions Precautions Precautions: Fall;Anterior Hip Precaution Booklet Issued: Yes (comment) Restrictions LLE Weight Bearing: Weight bearing as tolerated     Mobility  Bed  Mobility Overal bed mobility: Modified Independent             General bed mobility comments: supine to Rt EOB    Transfers Overall transfer level: Modified independent Equipment used: Rolling walker (2 wheels);None               General transfer comment: tolerates standing x2 minutes for gown closure and to greet her guest, then AMB begins    Ambulation/Gait Ambulation/Gait assistance: Min guard;Supervision Gait Distance (Feet): 80 Feet Assistive device: Rolling walker (2 wheels) Gait Pattern/deviations: Step-to pattern;Decreased step length - right       General Gait Details: stops to recover in standing at 51ft, mildly queasy, starting to feel hot, does report pain as a limitation. Pt encouraged to sit and recover to allow for pending stairs training.   Stairs Stairs: Yes Stairs assistance: Min guard Stair Management: One rail Right Number of Stairs: 4 General stair comments: up FWD, down backward, similar to prior to surgery. Good tolerance and problem solving overall.   Wheelchair Mobility    Modified Rankin (Stroke Patients Only)       Balance Overall balance assessment: Modified Independent                                          Cognition Arousal/Alertness: Awake/alert Behavior During Therapy: WFL for tasks assessed/performed Overall Cognitive Status: Within Functional Limits for tasks assessed  Exercises      General Comments        Pertinent Vitals/Pain Pain Assessment: No/denies pain Pain Score: 4  Pain Location: operative hip Pain Descriptors / Indicators: Aching Pain Intervention(s): Monitored during session;Premedicated before session;Repositioned;Limited activity within patient's tolerance    Home Living                          Prior Function            PT Goals (current goals can now be found in the care plan section) Acute Rehab PT  Goals Patient Stated Goal: "I wanna make sure I don't overdo it" PT Goal Formulation: With patient Time For Goal Achievement: 02/09/21 Potential to Achieve Goals: Good Progress towards PT goals: Progressing toward goals    Frequency    BID      PT Plan Current plan remains appropriate    Co-evaluation              AM-PAC PT "6 Clicks" Mobility   Outcome Measure  Help needed turning from your back to your side while in a flat bed without using bedrails?: None Help needed moving from lying on your back to sitting on the side of a flat bed without using bedrails?: None Help needed moving to and from a bed to a chair (including a wheelchair)?: None Help needed standing up from a chair using your arms (e.g., wheelchair or bedside chair)?: None Help needed to walk in hospital room?: A Little Help needed climbing 3-5 steps with a railing? : A Little 6 Click Score: 22    End of Session Equipment Utilized During Treatment: Gait belt Activity Tolerance: Treatment limited secondary to medical complications (Comment);Patient tolerated treatment well;Patient limited by pain (still mildly queasy while up AMB) Patient left: in chair;with call bell/phone within reach;with family/visitor present Nurse Communication: Mobility status PT Visit Diagnosis: Other abnormalities of gait and mobility (R26.89)     Time: 2197-5883 PT Time Calculation (min) (ACUTE ONLY): 19 min  Charges:  $Gait Training: 8-22 mins                    10:46 AM, 01/28/21 Rosamaria Lints, PT, DPT Physical Therapist - [facility]  [phone] (ASCOM)   Nelson Noone C 01/28/2021, 10:42 AM

## 2021-01-28 NOTE — Progress Notes (Signed)
Patient called out requesting something for pain. Offered patient oxycodone, patient preferred hydromorphone, states it doesn't make her nauseated and dizzy like oxycodone

## 2021-01-28 NOTE — Discharge Summary (Signed)
Physician Discharge Summary  Patient ID: Victoria [name] MRN: [id number] DOB/AGE: 54-Feb-1967 54 y.o.  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    She was given perioperative antibiotics:  Anti-infectives (From admission, onward)    Start     Dose/Rate Route Frequency Ordered Stop   01/26/21 1000  ceFAZolin (ANCEF) IVPB 2g/100 mL premix        2 g 200 mL/hr over 30 Minutes Intravenous Every 6 hours 01/26/21 0958 01/26/21 2316   01/26/21 0615  ceFAZolin (ANCEF) IVPB 2g/100 mL premix        2 g 200 mL/hr over 30 Minutes Intravenous On call to O.R. 01/26/21 0605 01/26/21 0809   01/26/21 0609  ceFAZolin (ANCEF) 1-4 GM/50ML-% IVPB       Note to Pharmacy: Desma Paganini S: cabinet override      01/26/21 0609 01/26/21 0800     .  She was given sequential compression devices, early ambulation, and aspirin for DVT prophylaxis.  She benefited maximally from the hospital stay and there were no complications.    Recent vital signs:  Vitals:   01/28/21 0342 01/28/21 0818   BP: 125/77 (!) 142/83  Pulse: 73 67  Resp: 14 16  Temp: 98.7 F (37.1 C) 99.2 F (37.3 C)  SpO2: 96% 97%    Recent laboratory studies:  Lab Results  Component Value Date   HGB 12.2 01/28/2021   HGB 13.1 01/27/2021   HGB 15.0 01/14/2021   Lab Results  Component Value Date   WBC 7.1 01/28/2021   PLT 162 01/28/2021   Lab Results  Component Value Date   INR 1.0 01/14/2021   Lab Results  Component Value Date   NA 134 (L) 01/27/2021   K 3.9 01/27/2021   CL 101 01/27/2021   CO2 26 01/27/2021   BUN 12 01/27/2021   CREATININE 0.64 01/27/2021   GLUCOSE 129 (H) 01/27/2021    Discharge Medications:   Allergies as of 01/28/2021       Reactions   Venlafaxine Other (See Comments)   Chest pain with high doses   Hydrocodone Nausea And Vomiting        Medication List     STOP taking these medications    traMADol 50 MG tablet Commonly known as: ULTRAM       TAKE these medications    acetaminophen 500 MG tablet Commonly known as: TYLENOL Take 500 mg by mouth every 8 (eight) hours as needed for moderate pain.   aspirin 81 MG chewable tablet Chew 1 tablet (81 mg total) by mouth 2 (two) times daily.  diclofenac Sodium 1 % Gel Commonly known as: VOLTAREN Apply topically 4 (four) times daily.   docusate sodium 100 MG capsule Commonly known as: COLACE Take 1 capsule (100 mg total) by mouth 2 (two) times daily.   HYDROmorphone 2 MG tablet Commonly known as: Dilaudid Take 1 tablet (2 mg total) by mouth every 4 (four) hours as needed for moderate pain.   HYDROmorphone 2 MG tablet Commonly known as: DILAUDID Take 1 tablet (2 mg total) by mouth every 4 (four) hours as needed for severe pain.   ketorolac 10 MG tablet Commonly known as: TORADOL Take 10 mg by mouth every 6 (six) hours as needed.   ondansetron 4 MG tablet Commonly known as: ZOFRAN Take 1 tablet (4 mg total) by mouth every 6 (six) hours as needed for nausea.   pantoprazole 40 MG  tablet Commonly known as: PROTONIX Take 40 mg by mouth daily as needed (acid reflux).               Durable Medical Equipment  (From admission, onward)           Start     Ordered   01/27/21 0820  For home use only DME Walker rolling  Once       Question Answer Comment  Walker: With 5 Inch Wheels   Patient needs a walker to treat with the following condition Osteoarthritis of left hip      01/27/21 0819   01/27/21 0819  For home use only DME 3 n 1  Once        01/27/21 0819   01/26/21 1241  DME Walker rolling  Once       Question:  Patient needs a walker to treat with the following condition  Answer:  History of total hip replacement, left   01/26/21 1240   01/26/21 1241  DME 3 n 1  Once        01/26/21 1240   01/26/21 1241  DME Bedside commode  Once       Question:  Patient needs a bedside commode to treat with the following condition  Answer:  History of total hip replacement, left   01/26/21 1240            Diagnostic Studies: DG C-Arm 1-60 Min-No Report  Result Date: 01/26/2021 Fluoroscopy was utilized by the requesting physician.  No radiographic interpretation.   DG HIP UNILAT WITH PELVIS 1V LEFT  Result Date: 01/26/2021 CLINICAL DATA:  Fluoroscopic assistance for left hip arthroplasty EXAM: DG HIP (WITH OR WITHOUT PELVIS) 1V*L* COMPARISON:  None. FINDINGS: Fluoroscopic assistance was provided for left hip arthroplasty. Fluoroscopic time was 8 seconds. Radiation dose is 0.978 mGy. IMPRESSION: Fluoroscopic assistance was provided for left hip arthroplasty. Electronically Signed   By: Ernie Avena M.D.   On: 01/26/2021 12:32    Disposition: Discharge disposition: 01-Home or Self Care            Signed: Altamese Cabal ,PA-C 01/28/2021, 12:16 PM

## 2023-10-17 ENCOUNTER — Other Ambulatory Visit: Payer: Self-pay | Admitting: Family Medicine

## 2023-10-17 ENCOUNTER — Ambulatory Visit
Admission: RE | Admit: 2023-10-17 | Discharge: 2023-10-17 | Disposition: A | Discharge status: Home / Self Care | Attending: Family Medicine | Admitting: Family Medicine

## 2023-10-17 ENCOUNTER — Ambulatory Visit
Admission: RE | Admit: 2023-10-17 | Discharge: 2023-10-17 | Disposition: A | Discharge status: Home / Self Care | Source: Ambulatory Visit | Attending: Family Medicine | Admitting: Family Medicine

## 2023-10-17 DIAGNOSIS — R059 Cough, unspecified: Secondary | ICD-10-CM
